# Patient Record
Sex: Male | Born: 1940 | Race: Black or African American | Hispanic: No | Marital: Single | Smoking: Former smoker
Health system: Southern US, Community
[De-identification: ages and names within clinical notes are randomized; demographics above are authoritative.]

## PROBLEM LIST (undated history)

## (undated) DIAGNOSIS — C801 Malignant (primary) neoplasm, unspecified: Secondary | ICD-10-CM

## (undated) DIAGNOSIS — I951 Orthostatic hypotension: Secondary | ICD-10-CM

## (undated) DIAGNOSIS — I10 Essential (primary) hypertension: Secondary | ICD-10-CM

## (undated) DIAGNOSIS — I5032 Chronic diastolic (congestive) heart failure: Secondary | ICD-10-CM

## (undated) DIAGNOSIS — E119 Type 2 diabetes mellitus without complications: Secondary | ICD-10-CM

## (undated) DIAGNOSIS — E785 Hyperlipidemia, unspecified: Secondary | ICD-10-CM

## (undated) DIAGNOSIS — I2699 Other pulmonary embolism without acute cor pulmonale: Secondary | ICD-10-CM

## (undated) DIAGNOSIS — I2510 Atherosclerotic heart disease of native coronary artery without angina pectoris: Secondary | ICD-10-CM

## (undated) DIAGNOSIS — M069 Rheumatoid arthritis, unspecified: Secondary | ICD-10-CM

## (undated) DIAGNOSIS — D497 Neoplasm of unspecified behavior of endocrine glands and other parts of nervous system: Secondary | ICD-10-CM

## (undated) HISTORY — DX: Orthostatic hypotension: I95.1

## (undated) HISTORY — PX: CORONARY STENT PLACEMENT: SHX1402

## (undated) HISTORY — PX: BRAIN SURGERY: SHX531

## (undated) HISTORY — DX: Type 2 diabetes mellitus without complications: E11.9

## (undated) HISTORY — DX: Other pulmonary embolism without acute cor pulmonale: I26.99

## (undated) HISTORY — DX: Chronic diastolic (congestive) heart failure: I50.32

## (undated) HISTORY — DX: Neoplasm of unspecified behavior of endocrine glands and other parts of nervous system: D49.7

---

## 2014-12-11 ENCOUNTER — Encounter (HOSPITAL_COMMUNITY): Payer: Self-pay | Admitting: Emergency Medicine

## 2014-12-11 ENCOUNTER — Inpatient Hospital Stay (HOSPITAL_COMMUNITY)
Admission: EM | Admit: 2014-12-11 | Discharge: 2014-12-15 | DRG: 190 | Disposition: A | Discharge status: Home / Self Care | Payer: Medicare Other | Attending: Internal Medicine | Admitting: Internal Medicine

## 2014-12-11 ENCOUNTER — Emergency Department (HOSPITAL_COMMUNITY): Payer: Medicare Other

## 2014-12-11 DIAGNOSIS — J45909 Unspecified asthma, uncomplicated: Secondary | ICD-10-CM | POA: Diagnosis present

## 2014-12-11 DIAGNOSIS — E8770 Fluid overload, unspecified: Secondary | ICD-10-CM | POA: Diagnosis not present

## 2014-12-11 DIAGNOSIS — F17210 Nicotine dependence, cigarettes, uncomplicated: Secondary | ICD-10-CM | POA: Diagnosis present

## 2014-12-11 DIAGNOSIS — Z881 Allergy status to other antibiotic agents status: Secondary | ICD-10-CM

## 2014-12-11 DIAGNOSIS — Z955 Presence of coronary angioplasty implant and graft: Secondary | ICD-10-CM

## 2014-12-11 DIAGNOSIS — R0602 Shortness of breath: Secondary | ICD-10-CM

## 2014-12-11 DIAGNOSIS — Z7952 Long term (current) use of systemic steroids: Secondary | ICD-10-CM

## 2014-12-11 DIAGNOSIS — E780 Pure hypercholesterolemia: Secondary | ICD-10-CM | POA: Diagnosis present

## 2014-12-11 DIAGNOSIS — J441 Chronic obstructive pulmonary disease with (acute) exacerbation: Secondary | ICD-10-CM

## 2014-12-11 DIAGNOSIS — R0789 Other chest pain: Secondary | ICD-10-CM

## 2014-12-11 DIAGNOSIS — E119 Type 2 diabetes mellitus without complications: Secondary | ICD-10-CM | POA: Diagnosis present

## 2014-12-11 DIAGNOSIS — J9601 Acute respiratory failure with hypoxia: Secondary | ICD-10-CM | POA: Diagnosis present

## 2014-12-11 DIAGNOSIS — J449 Chronic obstructive pulmonary disease, unspecified: Secondary | ICD-10-CM | POA: Diagnosis present

## 2014-12-11 DIAGNOSIS — E785 Hyperlipidemia, unspecified: Secondary | ICD-10-CM | POA: Diagnosis present

## 2014-12-11 DIAGNOSIS — I2510 Atherosclerotic heart disease of native coronary artery without angina pectoris: Secondary | ICD-10-CM | POA: Diagnosis present

## 2014-12-11 DIAGNOSIS — M069 Rheumatoid arthritis, unspecified: Secondary | ICD-10-CM | POA: Diagnosis present

## 2014-12-11 DIAGNOSIS — R0902 Hypoxemia: Secondary | ICD-10-CM

## 2014-12-11 HISTORY — DX: Hyperlipidemia, unspecified: E78.5

## 2014-12-11 HISTORY — DX: Rheumatoid arthritis, unspecified: M06.9

## 2014-12-11 HISTORY — DX: Atherosclerotic heart disease of native coronary artery without angina pectoris: I25.10

## 2014-12-11 LAB — CBC
HEMATOCRIT: 40.8 % (ref 39.0–52.0)
HEMOGLOBIN: 13 g/dL (ref 13.0–17.0)
MCH: 29.7 pg (ref 26.0–34.0)
MCHC: 31.9 g/dL (ref 30.0–36.0)
MCV: 93.2 fL (ref 78.0–100.0)
PLATELETS: 238 10*3/uL (ref 150–400)
RBC: 4.38 MIL/uL (ref 4.22–5.81)
RDW: 14.1 % (ref 11.5–15.5)
WBC: 9.3 10*3/uL (ref 4.0–10.5)

## 2014-12-11 LAB — I-STAT TROPONIN, ED: Troponin i, poc: 0.01 ng/mL (ref 0.00–0.08)

## 2014-12-11 LAB — BASIC METABOLIC PANEL
Anion gap: 9 (ref 5–15)
BUN: 7 mg/dL (ref 6–20)
CALCIUM: 9.1 mg/dL (ref 8.9–10.3)
CO2: 24 mmol/L (ref 22–32)
Chloride: 105 mmol/L (ref 101–111)
Creatinine, Ser: 1.41 mg/dL — ABNORMAL HIGH (ref 0.61–1.24)
GFR calc non Af Amer: 48 mL/min — ABNORMAL LOW (ref >60)
GFR, EST AFRICAN AMERICAN: 55 mL/min — AB (ref >60)
GLUCOSE: 142 mg/dL — AB (ref 65–99)
Potassium: 3.5 mmol/L (ref 3.5–5.1)
Sodium: 138 mmol/L (ref 135–145)

## 2014-12-11 NOTE — ED Notes (Signed)
C/o sob and pain in center of chest since July 2nd.  Also reports productive cough with cream colored sputum since June 26th.    Denies nausea and vomiting.

## 2014-12-11 NOTE — ED Provider Notes (Signed)
CSN: [id number]     Arrival date & time 12/11/14  2009 History  This chart was scribed for Zoie Sarin, MD by Chester Holstein, ED Scribe. This patient was seen in room A04C/A04C and the patient's care was started at 11:24 PM.    Chief Complaint  Patient presents with  . Shortness of Breath  . Chest Pain     Patient is a 74 y.o. male presenting with cough. The history is provided by the patient. No language interpreter was used.  Cough Cough characteristics:  Productive Sputum characteristics: cream. Severity:  Severe Onset quality:  Sudden Duration:  2 weeks Timing:  Intermittent Progression:  Worsening Chronicity:  New Smoker: yes   Context: smoke exposure   Relieved by:  None tried Worsened by:  Nothing tried Ineffective treatments:  None tried Associated symptoms: eye discharge, fever, headaches (mild), myalgias, rhinorrhea, shortness of breath, sore throat and wheezing   Associated symptoms: no chest pain and no chills   Risk factors: recent travel    HPI Comments: Jorge Malone is a 74 y.o. male with PMHx of DM, HLD, and COPD who presents to the Emergency Department complaining of productive cough with onset 11/24/14. Pt notes associated wheezing, chest wall pain with cough, SOB, scratchy throat, fever, headache, and eye drainage. Pt with h/o asthma, reports he has an inhaler which he reports does not work. He states he gets a shot in thigh but is unable to identify the medication. Pt denies h/o seasonal allergies. Pt reports he is currently on prednisone for h/o rheumatoid arthritis. Pt is visiting from [place]. He states he drove to [place] on 11/28/14. He is not on O2 at home. He is allergic to Amoxicillin which gives him diarrhea. Pt is a smoker. Pt denies chest pain and nausea.   Past Medical History  Diagnosis Date  . Diabetes mellitus without complication   . Rheumatoid arthritis   . High cholesterol    Past Surgical History  Procedure Laterality Date  . Coronary  stent placement    . Brain surgery     No family history on file. History  Substance Use Topics  . Smoking status: Current Every Day Smoker  . Smokeless tobacco: Not on file  . Alcohol Use: Yes    Review of Systems  Constitutional: Positive for fever. Negative for chills.  HENT: Positive for congestion, rhinorrhea and sore throat.   Eyes: Positive for discharge.  Respiratory: Positive for cough, shortness of breath and wheezing.   Cardiovascular: Negative for chest pain.  Musculoskeletal: Positive for myalgias.  Neurological: Positive for headaches (mild).  Hematological: Negative for adenopathy.  All other systems reviewed and are negative.     Allergies  Review of patient's allergies indicates not on file.  Home Medications   Prior to Admission medications   Not on File   BP 131/73 mmHg  Pulse 110  Temp(Src) 101.3 F (38.5 C) (Oral)  Resp 24  SpO2 94% Physical Exam  Constitutional: He is oriented to person, place, and time. He appears well-developed and well-nourished.  HENT:  Head: Normocephalic.  Nose: Rhinorrhea present.  Mouth/Throat: Uvula is midline and oropharynx is clear and moist. Mucous membranes are dry. No oropharyngeal exudate.  Nasal congestion and drainage  Eyes: EOM are normal. Pupils are equal, round, and reactive to light. Right eye exhibits discharge (clear, colorless). Left eye exhibits discharge (clear, colorless). Right conjunctiva is injected (mild). Left conjunctiva is injected (mild).  Neck: Normal range of motion. Neck supple.  Easily displaceable  trachea  Cardiovascular: Normal rate, regular rhythm and normal heart sounds.  Exam reveals no friction rub.   No murmur heard. Pulmonary/Chest: Effort normal. No respiratory distress. He has wheezes (bilaterally). He has rhonchi (bilaterally). He has no rales.  Abdominal: Soft. Bowel sounds are normal. There is no tenderness. There is no rebound and no guarding.  Musculoskeletal: Normal range  of motion. He exhibits no edema or tenderness.  Lymphadenopathy:    He has no cervical adenopathy.  Neurological: He is alert and oriented to person, place, and time. He has normal reflexes.  Skin: Skin is warm and dry. He is not diaphoretic.  Psychiatric: He has a normal mood and affect. His behavior is normal.  Nursing note and vitals reviewed.   ED Course  Procedures (including critical care time) DIAGNOSTIC STUDIES: Oxygen Saturation is 94% on room air, normal by my interpretation.    COORDINATION OF CARE: 11:29 PM Discussed treatment plan with patient at beside, the patient agrees with the plan and has no further questions at this time.   Labs Review Labs Reviewed  BASIC METABOLIC PANEL - Abnormal; Notable for the following:    Glucose, Bld 142 (*)    Creatinine, Ser 1.41 (*)    GFR calc non Af Amer 48 (*)    GFR calc Af Amer 55 (*)    All other components within normal limits  CBC  I-STAT TROPOININ, ED    Imaging Review Dg Chest 2 View  12/11/2014   CLINICAL DATA:  Left-sided chest pain for 1 day  EXAM: CHEST - 2 VIEW  COMPARISON:  None.  FINDINGS: The heart size and mediastinal contours are within normal limits. Both lungs are clear. The visualized skeletal structures are unremarkable.  IMPRESSION: No active disease.   Electronically Signed   By: Inez Catalina M.D.   On: 12/11/2014 21:42     EKG Interpretation None      EKG Interpretation  Date/Time:  Wednesday December 11 2014 20:19:31 EDT Ventricular Rate:  106 PR Interval:  140 QRS Duration: 76 QT Interval:  336 QTC Calculation: 446 R Axis:   83 Text Interpretation:  Sinus tachycardia with Premature supraventricular complexes and with occasional Premature ventricular complexes Low voltage QRS Borderline ECG ED PHYSICIAN INTERPRETATION AVAILABLE IN [facility] Confirmed by TEST, Record ([id number]) on 12/12/2014 7:37:41 AM       MDM   Final diagnoses:  None   Results for orders placed or performed during the  hospital encounter of 12/11/14  Rapid strep screen (not at [facility])  Result Value Ref Range   Streptococcus, Group A Screen (Direct) NEGATIVE NEGATIVE  Culture, blood (routine x 2) Call MD if unable to obtain prior to antibiotics being given  Result Value Ref Range   Specimen Description BLOOD RIGHT ARM    Special Requests      BOTTLES DRAWN AEROBIC AND ANAEROBIC 10CC AEROBIC 5CC ANAEROBIC   Culture PENDING    Report Status PENDING   Basic metabolic panel  Result Value Ref Range   Sodium 138 135 - 145 mmol/L   Potassium 3.5 3.5 - 5.1 mmol/L   Chloride 105 101 - 111 mmol/L   CO2 24 22 - 32 mmol/L   Glucose, Bld 142 (H) 65 - 99 mg/dL   BUN 7 6 - 20 mg/dL   Creatinine, Ser 1.41 (H) 0.61 - 1.24 mg/dL   Calcium 9.1 8.9 - 10.3 mg/dL   GFR calc non Af Amer 48 (L) >60 mL/min   GFR calc  Af Amer 55 (L) >60 mL/min   Anion gap 9 5 - 15  CBC  Result Value Ref Range   WBC 9.3 4.0 - 10.5 K/uL   RBC 4.38 4.22 - 5.81 MIL/uL   Hemoglobin 13.0 13.0 - 17.0 g/dL   HCT 40.8 39.0 - 52.0 %   MCV 93.2 78.0 - 100.0 fL   MCH 29.7 26.0 - 34.0 pg   MCHC 31.9 30.0 - 36.0 g/dL   RDW 14.1 11.5 - 15.5 %   Platelets 238 150 - 400 K/uL  I-stat troponin, ED  Result Value Ref Range   Troponin i, poc 0.01 0.00 - 0.08 ng/mL   Comment 3           Dg Chest 2 View  12/11/2014   CLINICAL DATA:  Left-sided chest pain for 1 day  EXAM: CHEST - 2 VIEW  COMPARISON:  None.  FINDINGS: The heart size and mediastinal contours are within normal limits. Both lungs are clear. The visualized skeletal structures are unremarkable.  IMPRESSION: No active disease.   Electronically Signed   By: Inez Catalina M.D.   On: 12/11/2014 21:42   Ct Angio Chest Pe W/cm &/or Wo Cm  12/12/2014   CLINICAL DATA:  Left mid chest pain and shortness of breath starting at the end of June. Cough.  EXAM: CT ANGIOGRAPHY CHEST WITH CONTRAST  TECHNIQUE: Multidetector CT imaging of the chest was performed using the standard protocol during bolus  administration of intravenous contrast. Multiplanar CT image reconstructions and MIPs were obtained to evaluate the vascular anatomy.  CONTRAST:  57mL OMNIPAQUE IOHEXOL 350 MG/ML SOLN  COMPARISON:  None.  FINDINGS: Technically adequate study with good opacification of the segmental and central pulmonary arteries. No focal filling defect. No evidence of significant pulmonary embolus.  Normal heart size. Coronary artery calcification. Normal caliber thoracic aorta. Great vessel origins are patent. Esophagus is decompressed. There are scattered lymph nodes diffusely throughout the mediastinum and right infrahilar region. Largest lymph nodes measure up to about 12 mm short axis dimension. Appearance is nonspecific. These could represent reactive nodes although early lymphoma or neoplastic changes not excluded.  Evaluation of lungs is limited due to respiratory motion artifact. There is evidence of atelectasis or scarring in the lung bases. No distinct focal nodules or consolidation demonstrated. Airways are patent. No pleural effusion. No pneumothorax.  Included portions of the upper abdominal organs are grossly unremarkable. Degenerative changes throughout the spine. No destructive bone lesions.  Review of the MIP images confirms the above findings.  IMPRESSION: No evidence of significant pulmonary embolus. Indeterminate mildly enlarged lymph nodes in the mediastinum are probably reactive. Fibrosis or atelectasis in the lung bases.   Electronically Signed   By: Lucienne Capers M.D.   On: 12/12/2014 02:48     Medications  metoprolol tartrate (LOPRESSOR) tablet 25 mg (not administered)  rosuvastatin (CRESTOR) tablet 5 mg (not administered)  folic acid (FOLVITE) tablet 1 mg (not administered)  predniSONE (DELTASONE) tablet 50 mg (not administered)  insulin aspart (novoLOG) injection 0-15 Units (not administered)  heparin injection 5,000 Units (5,000 Units Subcutaneous Given 12/12/14 0600)  acetaminophen  (TYLENOL) tablet 650 mg (650 mg Oral Given 12/12/14 0600)    Or  acetaminophen (TYLENOL) suppository 650 mg ( Rectal See Alternative 12/12/14 0600)  cefTRIAXone (ROCEPHIN) 1 g in dextrose 5 % 50 mL IVPB (not administered)  azithromycin (ZITHROMAX) tablet 500 mg (not administered)  0.9 %  sodium chloride infusion ( Intravenous Transfusing/Transfer 12/12/14 0404)  iohexol (  OMNIPAQUE) 350 MG/ML injection 100 mL (80 mLs Intravenous Contrast Given 12/12/14 0226)  sodium chloride 0.9 % bolus 1,000 mL (1,000 mLs Intravenous Transfusing/Transfer 12/12/14 0404)  methylPREDNISolone sodium succinate (SOLU-MEDROL) 125 mg/2 mL injection 125 mg (125 mg Intravenous Given 12/12/14 0331)  albuterol (PROVENTIL) (2.5 MG/3ML) 0.083% nebulizer solution 5 mg (5 mg Nebulization Given 12/12/14 0330)  cefTRIAXone (ROCEPHIN) 1 g in dextrose 5 % 50 mL IVPB (1 g Intravenous Transfusing/Transfer 12/12/14 0404)  azithromycin (ZITHROMAX) 500 mg in dextrose 5 % 250 mL IVPB (500 mg Intravenous Given 12/12/14 0554)   Will admit suspect burgeoning PNA on immune suppressants (prednisone and ENBREL)    I personally performed the services described in this documentation, which was scribed in my presence. The recorded information has been reviewed and is accurate.     Veatrice Kells, MD 12/12/14 [phone]

## 2014-12-12 ENCOUNTER — Emergency Department (HOSPITAL_COMMUNITY): Payer: Medicare Other

## 2014-12-12 ENCOUNTER — Encounter (HOSPITAL_COMMUNITY): Payer: Self-pay

## 2014-12-12 DIAGNOSIS — E785 Hyperlipidemia, unspecified: Secondary | ICD-10-CM | POA: Diagnosis present

## 2014-12-12 DIAGNOSIS — J441 Chronic obstructive pulmonary disease with (acute) exacerbation: Principal | ICD-10-CM

## 2014-12-12 DIAGNOSIS — J449 Chronic obstructive pulmonary disease, unspecified: Secondary | ICD-10-CM | POA: Diagnosis present

## 2014-12-12 DIAGNOSIS — I2510 Atherosclerotic heart disease of native coronary artery without angina pectoris: Secondary | ICD-10-CM | POA: Diagnosis present

## 2014-12-12 DIAGNOSIS — J45909 Unspecified asthma, uncomplicated: Secondary | ICD-10-CM | POA: Diagnosis present

## 2014-12-12 DIAGNOSIS — E119 Type 2 diabetes mellitus without complications: Secondary | ICD-10-CM

## 2014-12-12 DIAGNOSIS — J9601 Acute respiratory failure with hypoxia: Secondary | ICD-10-CM | POA: Diagnosis present

## 2014-12-12 DIAGNOSIS — R0602 Shortness of breath: Secondary | ICD-10-CM | POA: Diagnosis present

## 2014-12-12 DIAGNOSIS — E8770 Fluid overload, unspecified: Secondary | ICD-10-CM | POA: Diagnosis not present

## 2014-12-12 DIAGNOSIS — Z7952 Long term (current) use of systemic steroids: Secondary | ICD-10-CM | POA: Diagnosis not present

## 2014-12-12 DIAGNOSIS — Z955 Presence of coronary angioplasty implant and graft: Secondary | ICD-10-CM | POA: Diagnosis not present

## 2014-12-12 DIAGNOSIS — E780 Pure hypercholesterolemia: Secondary | ICD-10-CM | POA: Diagnosis present

## 2014-12-12 DIAGNOSIS — F17210 Nicotine dependence, cigarettes, uncomplicated: Secondary | ICD-10-CM | POA: Diagnosis present

## 2014-12-12 DIAGNOSIS — M069 Rheumatoid arthritis, unspecified: Secondary | ICD-10-CM | POA: Diagnosis present

## 2014-12-12 DIAGNOSIS — Z881 Allergy status to other antibiotic agents status: Secondary | ICD-10-CM | POA: Diagnosis not present

## 2014-12-12 DIAGNOSIS — R0600 Dyspnea, unspecified: Secondary | ICD-10-CM | POA: Diagnosis not present

## 2014-12-12 LAB — RAPID STREP SCREEN (MED CTR MEBANE ONLY): Streptococcus, Group A Screen (Direct): NEGATIVE

## 2014-12-12 LAB — HIV ANTIBODY (ROUTINE TESTING W REFLEX): HIV Screen 4th Generation wRfx: NONREACTIVE

## 2014-12-12 LAB — GLUCOSE, CAPILLARY
GLUCOSE-CAPILLARY: 180 mg/dL — AB (ref 65–99)
Glucose-Capillary: 187 mg/dL — ABNORMAL HIGH (ref 65–99)
Glucose-Capillary: 164 mg/dL — ABNORMAL HIGH (ref 65–99)

## 2014-12-12 MED ORDER — AZITHROMYCIN 500 MG PO TABS
500.0000 mg | ORAL_TABLET | ORAL | Status: DC
  Administered 2014-12-13: 500 mg via ORAL
  Filled 2014-12-12 (×2): qty 1

## 2014-12-12 MED ORDER — LEVALBUTEROL HCL 0.63 MG/3ML IN NEBU
0.6300 mg | INHALATION_SOLUTION | Freq: Two times a day (BID) | RESPIRATORY_TRACT | Status: DC
  Administered 2014-12-12 – 2014-12-15 (×6): 0.63 mg via RESPIRATORY_TRACT
  Filled 2014-12-12 (×12): qty 3

## 2014-12-12 MED ORDER — METOPROLOL TARTRATE 25 MG PO TABS
25.0000 mg | ORAL_TABLET | Freq: Every day | ORAL | Status: DC
  Administered 2014-12-12 – 2014-12-15 (×4): 25 mg via ORAL
  Filled 2014-12-12 (×4): qty 1

## 2014-12-12 MED ORDER — DEXTROSE 5 % IV SOLN
500.0000 mg | Freq: Once | INTRAVENOUS | Status: AC
  Administered 2014-12-12: 500 mg via INTRAVENOUS
  Filled 2014-12-12: qty 500

## 2014-12-12 MED ORDER — PREDNISONE 50 MG PO TABS: 50.0000 mg | ORAL_TABLET | Freq: Every day | ORAL | Status: DC

## 2014-12-12 MED ORDER — HEPARIN SODIUM (PORCINE) 5000 UNIT/ML IJ SOLN
5000.0000 [IU] | Freq: Three times a day (TID) | INTRAMUSCULAR | Status: DC
  Administered 2014-12-12 – 2014-12-15 (×11): 5000 [IU] via SUBCUTANEOUS
  Filled 2014-12-12 (×15): qty 1

## 2014-12-12 MED ORDER — ROSUVASTATIN CALCIUM 5 MG PO TABS
5.0000 mg | ORAL_TABLET | Freq: Every day | ORAL | Status: DC
  Administered 2014-12-12 – 2014-12-14 (×3): 5 mg via ORAL
  Filled 2014-12-12 (×6): qty 1

## 2014-12-12 MED ORDER — DEXTROSE 5 % IV SOLN
1.0000 g | INTRAVENOUS | Status: DC
  Administered 2014-12-13: 1 g via INTRAVENOUS
  Filled 2014-12-12 (×2): qty 10

## 2014-12-12 MED ORDER — ACETAMINOPHEN 650 MG RE SUPP: 650.0000 mg | Freq: Four times a day (QID) | RECTAL | Status: DC | PRN

## 2014-12-12 MED ORDER — IPRATROPIUM BROMIDE 0.02 % IN SOLN
0.5000 mg | Freq: Four times a day (QID) | RESPIRATORY_TRACT | Status: DC
  Administered 2014-12-12: 0.5 mg via RESPIRATORY_TRACT

## 2014-12-12 MED ORDER — METHYLPREDNISOLONE SODIUM SUCC 125 MG IJ SOLR
125.0000 mg | Freq: Once | INTRAMUSCULAR | Status: AC
Start: 2014-12-12 — End: 2014-12-12
  Administered 2014-12-12: 125 mg via INTRAVENOUS
  Filled 2014-12-12: qty 2

## 2014-12-12 MED ORDER — GUAIFENESIN ER 600 MG PO TB12
1200.0000 mg | ORAL_TABLET | Freq: Two times a day (BID) | ORAL | Status: DC
  Administered 2014-12-12 – 2014-12-15 (×7): 1200 mg via ORAL
  Filled 2014-12-12 (×9): qty 2

## 2014-12-12 MED ORDER — LEVALBUTEROL HCL 0.63 MG/3ML IN NEBU
0.6300 mg | INHALATION_SOLUTION | RESPIRATORY_TRACT | Status: DC | PRN
  Filled 2014-12-12: qty 3

## 2014-12-12 MED ORDER — PREDNISONE 50 MG PO TABS
60.0000 mg | ORAL_TABLET | Freq: Every day | ORAL | Status: DC
  Administered 2014-12-12 – 2014-12-14 (×3): 60 mg via ORAL
  Filled 2014-12-12 (×6): qty 1

## 2014-12-12 MED ORDER — ACETAMINOPHEN 325 MG PO TABS
650.0000 mg | ORAL_TABLET | Freq: Four times a day (QID) | ORAL | Status: DC | PRN
  Administered 2014-12-12: 650 mg via ORAL
  Filled 2014-12-12: qty 2

## 2014-12-12 MED ORDER — IPRATROPIUM BROMIDE 0.02 % IN SOLN
0.5000 mg | RESPIRATORY_TRACT | Status: DC | PRN
  Filled 2014-12-12: qty 2.5

## 2014-12-12 MED ORDER — ALBUTEROL SULFATE (2.5 MG/3ML) 0.083% IN NEBU
5.0000 mg | INHALATION_SOLUTION | Freq: Once | RESPIRATORY_TRACT | Status: AC
  Administered 2014-12-12: 5 mg via RESPIRATORY_TRACT
  Filled 2014-12-12: qty 6

## 2014-12-12 MED ORDER — LEVALBUTEROL HCL 0.63 MG/3ML IN NEBU
0.6300 mg | INHALATION_SOLUTION | Freq: Four times a day (QID) | RESPIRATORY_TRACT | Status: DC
  Administered 2014-12-12: 0.63 mg via RESPIRATORY_TRACT

## 2014-12-12 MED ORDER — LEVOFLOXACIN IN D5W 750 MG/150ML IV SOLN: 750.0000 mg | Freq: Once | INTRAVENOUS | Status: DC

## 2014-12-12 MED ORDER — IPRATROPIUM BROMIDE 0.02 % IN SOLN
0.5000 mg | Freq: Two times a day (BID) | RESPIRATORY_TRACT | Status: DC
  Administered 2014-12-12 – 2014-12-15 (×6): 0.5 mg via RESPIRATORY_TRACT
  Filled 2014-12-12 (×6): qty 2.5

## 2014-12-12 MED ORDER — SODIUM CHLORIDE 0.9 % IV SOLN
INTRAVENOUS | Status: DC
  Administered 2014-12-12 (×2): via INTRAVENOUS

## 2014-12-12 MED ORDER — DEXTROSE 5 % IV SOLN
1.0000 g | Freq: Once | INTRAVENOUS | Status: AC
  Administered 2014-12-12: 1 g via INTRAVENOUS
  Filled 2014-12-12: qty 10

## 2014-12-12 MED ORDER — INSULIN ASPART 100 UNIT/ML ~~LOC~~ SOLN
0.0000 [IU] | Freq: Three times a day (TID) | SUBCUTANEOUS | Status: DC
  Administered 2014-12-12 – 2014-12-14 (×5): 3 [IU] via SUBCUTANEOUS

## 2014-12-12 MED ORDER — FOLIC ACID 1 MG PO TABS
1.0000 mg | ORAL_TABLET | Freq: Every day | ORAL | Status: DC
  Administered 2014-12-12 – 2014-12-15 (×4): 1 mg via ORAL
  Filled 2014-12-12 (×4): qty 1

## 2014-12-12 MED ORDER — SODIUM CHLORIDE 0.9 % IV BOLUS (SEPSIS)
1000.0000 mL | Freq: Once | INTRAVENOUS | Status: AC
  Administered 2014-12-12: 1000 mL via INTRAVENOUS

## 2014-12-12 MED ORDER — IOHEXOL 350 MG/ML SOLN
100.0000 mL | Freq: Once | INTRAVENOUS | Status: AC | PRN
  Administered 2014-12-12: 80 mL via INTRAVENOUS

## 2014-12-12 NOTE — Progress Notes (Signed)
New Admission Note:  Arrival Method: via stretcher  Mental Orientation:Alert &oriented x4 Telemetry: on box 18 Assessment: Completed Skin: edema BLE, drainage from both eyes, swollen knuckles IV: left upper arm Pain: pain upon coughing, see MAR Tubes: n/a Admission: Completed [address] Orientation: Patient has been orientated to the room, unit and the staff. Family: none at bedside  Orders have been reviewed and implemented. Will continue to monitor the patient. Call light has been placed within reach and bed alarm has been activated.   Leandro Reasoner BSN, RN  Phone Number: [phone] [facility]

## 2014-12-12 NOTE — ED Notes (Signed)
Patient transported to CT scan . 

## 2014-12-12 NOTE — Progress Notes (Signed)
I have seen and assessed this patient and agree with Dr. Alcario Drought assessment and plan. Patient states he is feeling much better than on admission. Continue steroids, IV antibiotics, IV fluids, nebulizer treatments. Follow.

## 2014-12-12 NOTE — ED Notes (Signed)
Report given to RN on 6E. 

## 2014-12-12 NOTE — H&P (Addendum)
[facility] History and Physical  Jorge Malone NGE:[id number] DOB: Aug 15, 1940 DOA: 12/11/2014  Referring physician: EDP PCP: No primary care provider on file.   Chief Complaint: SOB   HPI: Jorge Malone is a 74 y.o. male with h/o COPD, presents to ED with c/o productive cough onset on 6/26.  There has been associated wheezing, chest tightness, SOB, fever, headache.  Inhaler not providing relief.  Is on immunosuppressive embril for his chronic rheumatoid arthritis as well as chronic daily prednisone.  Not on O2 at home.  Review of Systems: Systems reviewed.  As above, otherwise negative  Past Medical History  Diagnosis Date  . Diabetes mellitus without complication   . Rheumatoid arthritis   . High cholesterol   . Coronary artery disease    Past Surgical History  Procedure Laterality Date  . Coronary stent placement    . Brain surgery     Social History:  reports that he has been smoking.  He does not have any smokeless tobacco history on file. He reports that he drinks alcohol. He reports that he does not use illicit drugs.  Allergies  Allergen Reactions  . Amoxicillin     No family history on file.   Prior to Admission medications   Medication Sig Start Date End Date Taking? Authorizing Provider  Etanercept (ENBREL [place]) Inject 1 Dose into the skin once a week. Takes weekly on Thursday   Yes Historical Provider, MD  fenofibrate (TRICOR) 48 MG tablet Take 48 mg by mouth daily.   Yes Historical Provider, MD  folic acid (FOLVITE) 1 MG tablet Take 1 mg by mouth daily.   Yes Historical Provider, MD  hydrochlorothiazide (HYDRODIURIL) 25 MG tablet Take 25 mg by mouth daily.   Yes Historical Provider, MD  metFORMIN (GLUCOPHAGE) 1000 MG tablet Take 1,000 mg by mouth daily with breakfast.   Yes Historical Provider, MD  metoprolol tartrate (LOPRESSOR) 25 MG tablet Take 25 mg by mouth daily.   Yes Historical Provider, MD  predniSONE (DELTASONE) 5 MG tablet Take 5 mg by mouth daily  with breakfast.   Yes Historical Provider, MD  rosuvastatin (CRESTOR) 5 MG tablet Take 5 mg by mouth daily.   Yes Historical Provider, MD   Physical Exam: Filed Vitals:   12/12/14 0300  BP: 172/92  Pulse: 109  Temp:   Resp: 20    BP 172/92 mmHg  Pulse 109  Temp(Src) 100.7 F (38.2 C) (Oral)  Resp 20  SpO2 92%  General Appearance:    Alert, oriented, no distress, appears stated age  Head:    Normocephalic, atraumatic  Eyes:    PERRL, EOMI, sclera non-icteric        Nose:   Nares without drainage or epistaxis. Mucosa, turbinates normal  Throat:   Moist mucous membranes. Oropharynx without erythema or exudate.  Neck:   Supple. No carotid bruits.  No thyromegaly.  No lymphadenopathy.   Back:     No CVA tenderness, no spinal tenderness  Lungs:     Clear to auscultation bilaterally, without wheezes, rhonchi or rales  Chest wall:    No tenderness to palpitation  Heart:    Regular rate and rhythm without murmurs, gallops, rubs  Abdomen:     Soft, non-tender, nondistended, normal bowel sounds, no organomegaly  Genitalia:    deferred  Rectal:    deferred  Extremities:   No clubbing, cyanosis or edema.  Pulses:   2+ and symmetric all extremities  Skin:   Skin color, texture, turgor  normal, no rashes or lesions  Lymph nodes:   Cervical, supraclavicular, and axillary nodes normal  Neurologic:   CNII-XII intact. Normal strength, sensation and reflexes      throughout    Labs on Admission:  Basic Metabolic Panel:  Recent Labs Lab 12/11/14 2037  NA 138  K 3.5  CL 105  CO2 24  GLUCOSE 142*  BUN 7  CREATININE 1.41*  CALCIUM 9.1   Liver Function Tests: No results for input(s): AST, ALT, ALKPHOS, BILITOT, PROT, ALBUMIN in the last 168 hours. No results for input(s): LIPASE, AMYLASE in the last 168 hours. No results for input(s): AMMONIA in the last 168 hours. CBC:  Recent Labs Lab 12/11/14 2037  WBC 9.3  HGB 13.0  HCT 40.8  MCV 93.2  PLT 238   Cardiac Enzymes: No  results for input(s): CKTOTAL, CKMB, CKMBINDEX, TROPONINI in the last 168 hours.  BNP (last 3 results) No results for input(s): PROBNP in the last 8760 hours. CBG: No results for input(s): GLUCAP in the last 168 hours.  Radiological Exams on Admission: Dg Chest 2 View  12/11/2014   CLINICAL DATA:  Left-sided chest pain for 1 day  EXAM: CHEST - 2 VIEW  COMPARISON:  None.  FINDINGS: The heart size and mediastinal contours are within normal limits. Both lungs are clear. The visualized skeletal structures are unremarkable.  IMPRESSION: No active disease.   Electronically Signed   By: Inez Catalina M.D.   On: 12/11/2014 21:42   Ct Angio Chest Pe W/cm &/or Wo Cm  12/12/2014   CLINICAL DATA:  Left mid chest pain and shortness of breath starting at the end of June. Cough.  EXAM: CT ANGIOGRAPHY CHEST WITH CONTRAST  TECHNIQUE: Multidetector CT imaging of the chest was performed using the standard protocol during bolus administration of intravenous contrast. Multiplanar CT image reconstructions and MIPs were obtained to evaluate the vascular anatomy.  CONTRAST:  46mL OMNIPAQUE IOHEXOL 350 MG/ML SOLN  COMPARISON:  None.  FINDINGS: Technically adequate study with good opacification of the segmental and central pulmonary arteries. No focal filling defect. No evidence of significant pulmonary embolus.  Normal heart size. Coronary artery calcification. Normal caliber thoracic aorta. Great vessel origins are patent. Esophagus is decompressed. There are scattered lymph nodes diffusely throughout the mediastinum and right infrahilar region. Largest lymph nodes measure up to about 12 mm short axis dimension. Appearance is nonspecific. These could represent reactive nodes although early lymphoma or neoplastic changes not excluded.  Evaluation of lungs is limited due to respiratory motion artifact. There is evidence of atelectasis or scarring in the lung bases. No distinct focal nodules or consolidation demonstrated. Airways  are patent. No pleural effusion. No pneumothorax.  Included portions of the upper abdominal organs are grossly unremarkable. Degenerative changes throughout the spine. No destructive bone lesions.  Review of the MIP images confirms the above findings.  IMPRESSION: No evidence of significant pulmonary embolus. Indeterminate mildly enlarged lymph nodes in the mediastinum are probably reactive. Fibrosis or atelectasis in the lung bases.   Electronically Signed   By: Lucienne Capers M.D.   On: 12/12/2014 02:48    EKG: Independently reviewed.  Assessment/Plan Principal Problem:   COPD exacerbation Active Problems:   Rheumatoid arthritis   DM2 (diabetes mellitus, type 2)   CAD (coronary artery disease)   Acute respiratory failure with hypoxia   1. COPD exacerbation causing acute respiratory failure with hypoxia - with fever, productive cough, will go ahead and treat for PNA clinically even  though CXR negative. 1. Adult wheeze protocol 2. Prednisone PO 3. Hold embril 4. Continuous pulse ox 5. PNA pathway 6. Cultures pending 2. RA - hold embril, on stress dose steroids instead 3. DM2 - hold metformin, putting patient on med dose SSI AC/HS    Code Status: Full  Family Communication: No family in room Disposition Plan: Admit to inpatient   Time spent: 53 min  GARDNER, JARED M. [facility] Pager [phone]  If 7AM-7PM, please contact the day team taking care of the patient [email or web address] Password [facility] 12/12/2014, 3:27 AM

## 2014-12-13 ENCOUNTER — Encounter (HOSPITAL_COMMUNITY): Payer: Self-pay | Admitting: Internal Medicine

## 2014-12-13 ENCOUNTER — Inpatient Hospital Stay (HOSPITAL_COMMUNITY): Payer: Medicare Other

## 2014-12-13 DIAGNOSIS — E785 Hyperlipidemia, unspecified: Secondary | ICD-10-CM | POA: Diagnosis present

## 2014-12-13 HISTORY — DX: Hyperlipidemia, unspecified: E78.5

## 2014-12-13 LAB — CBC
HCT: 37.8 % — ABNORMAL LOW (ref 39.0–52.0)
Hemoglobin: 12 g/dL — ABNORMAL LOW (ref 13.0–17.0)
MCH: 29.6 pg (ref 26.0–34.0)
MCHC: 31.7 g/dL (ref 30.0–36.0)
MCV: 93.1 fL (ref 78.0–100.0)
Platelets: 203 10*3/uL (ref 150–400)
RBC: 4.06 MIL/uL — ABNORMAL LOW (ref 4.22–5.81)
RDW: 13.8 % (ref 11.5–15.5)
WBC: 10.8 10*3/uL — ABNORMAL HIGH (ref 4.0–10.5)

## 2014-12-13 LAB — BASIC METABOLIC PANEL
Anion gap: 5 (ref 5–15)
BUN: 11 mg/dL (ref 6–20)
CHLORIDE: 111 mmol/L (ref 101–111)
CO2: 24 mmol/L (ref 22–32)
Calcium: 9.1 mg/dL (ref 8.9–10.3)
Creatinine, Ser: 0.92 mg/dL (ref 0.61–1.24)
GFR calc Af Amer: >60 mL/min (ref >60)
GFR calc non Af Amer: >60 mL/min (ref >60)
Glucose, Bld: 141 mg/dL — ABNORMAL HIGH (ref 65–99)
Potassium: 4.1 mmol/L (ref 3.5–5.1)
Sodium: 140 mmol/L (ref 135–145)

## 2014-12-13 LAB — GLUCOSE, CAPILLARY
GLUCOSE-CAPILLARY: 154 mg/dL — AB (ref 65–99)
GLUCOSE-CAPILLARY: 106 mg/dL — AB (ref 65–99)
Glucose-Capillary: 102 mg/dL — ABNORMAL HIGH (ref 65–99)
Glucose-Capillary: 161 mg/dL — ABNORMAL HIGH (ref 65–99)

## 2014-12-13 LAB — STREP PNEUMONIAE URINARY ANTIGEN: Strep Pneumo Urinary Antigen: NEGATIVE

## 2014-12-13 MED ORDER — LEVOFLOXACIN 500 MG PO TABS
500.0000 mg | ORAL_TABLET | Freq: Every day | ORAL | Status: DC
  Administered 2014-12-13 – 2014-12-15 (×3): 500 mg via ORAL
  Filled 2014-12-13 (×3): qty 1

## 2014-12-13 MED ORDER — FUROSEMIDE 10 MG/ML IJ SOLN
40.0000 mg | Freq: Two times a day (BID) | INTRAMUSCULAR | Status: AC
  Administered 2014-12-13 – 2014-12-14 (×2): 40 mg via INTRAVENOUS
  Filled 2014-12-13 (×2): qty 4

## 2014-12-13 MED ORDER — FUROSEMIDE 10 MG/ML IJ SOLN: 40.0000 mg | Freq: Once | INTRAMUSCULAR | Status: DC

## 2014-12-13 NOTE — Progress Notes (Signed)
[facility] PROGRESS NOTE  Jorge Malone OMV:[id number] DOB: 01/30/1941 DOA: 12/11/2014 PCP: No primary care provider on file.  Assessment/Plan: #1 acute respiratory failure with hypoxia secondary to acute COPD exacerbation Patient with clinical improvement. Repeat chest x-ray to rule out a pneumonia. Repeat chest x-ray with some volume overload which was not present on admitting chest x-ray, likely secondary to fluid resuscitation and holding patient's diuretics. Urine strep pneumococcus antigen is negative. Blood cultures pending. Sputum Gram stain and cultures pending. Rapid strep screen was negative. Patient is +1.9 L during this hospitalization. Saline lock IV fluids. Continue prednisone, scheduled nebs, change IV Rocephin and oral azithromycin to Levaquin. We'll give a dose of IV Lasix 1.  #2 history of rheumatoid arthritis Embril on hold. Continue current dose of prednisone and will taper down to home dose.  #3 diabetes mellitus type 2 Oral hypoglycemics on hold. Sliding scale insulin.  #4 coronary artery disease Stable. Continue beta blocker. HCTZ was held on admission. Continue statin.  #5 hyperlipidemia Continue statin.  #6 prophylaxis Heparin for DVT prophylaxis.  Code Status: Full Family Communication: Updated patient. Updated daughter via telephone. Disposition Plan: Home when medically stable.   Consultants:  None  Procedures:  Chest x-ray 12/13/2014  Antibiotics:  IV Rocephin 12/13/2014  Oral azithromycin 12/13/2014  HPI/Subjective: Patient states shortness of breath has improved. Patient states he's feeling better. Patient still complaining of cough.  Objective: Filed Vitals:   12/13/14 1738  BP: 131/71  Pulse: 75  Temp: 98 F (36.7 C)  Resp: 20    Intake/Output Summary (Last 24 hours) at 12/13/14 1844 Last data filed at 12/13/14 1700  Gross per 24 hour  Intake   2150 ml  Output    550 ml  Net   1600 ml   There were no vitals filed  for this visit.  Exam:   General:  NAD  Cardiovascular: RRR  Respiratory: Minimal expiratory wheezing no rhonchi. Some scattered coarse breath sounds. No crackles.  Abdomen: Obese, soft, nontender, nondistended, positive bowel sounds.  Musculoskeletal: No clubbing cyanosis or edema.  Data Reviewed: Basic Metabolic Panel:  Recent Labs Lab 12/11/14 2037 12/13/14 0404  NA 138 140  K 3.5 4.1  CL 105 111  CO2 24 24  GLUCOSE 142* 141*  BUN 7 11  CREATININE 1.41* 0.92  CALCIUM 9.1 9.1   Liver Function Tests: No results for input(s): AST, ALT, ALKPHOS, BILITOT, PROT, ALBUMIN in the last 168 hours. No results for input(s): LIPASE, AMYLASE in the last 168 hours. No results for input(s): AMMONIA in the last 168 hours. CBC:  Recent Labs Lab 12/11/14 2037 12/13/14 0404  WBC 9.3 10.8*  HGB 13.0 12.0*  HCT 40.8 37.8*  MCV 93.2 93.1  PLT 238 203   Cardiac Enzymes: No results for input(s): CKTOTAL, CKMB, CKMBINDEX, TROPONINI in the last 168 hours. BNP (last 3 results) No results for input(s): BNP in the last 8760 hours.  ProBNP (last 3 results) No results for input(s): PROBNP in the last 8760 hours.  CBG:  Recent Labs Lab 12/12/14 1610 12/12/14 2102 12/13/14 0803 12/13/14 1201 12/13/14 1614  GLUCAP 164* 154* 102* 106* 161*    Recent Results (from the past 240 hour(s))  Rapid strep screen (not at [facility])     Status: None   Collection Time: 12/11/14 11:55 PM  Result Value Ref Range Status   Streptococcus, Group A Screen (Direct) NEGATIVE NEGATIVE Final    Comment: (NOTE) A Rapid Antigen test may result negative if the antigen  level in the sample is below the detection level of this test. The FDA has not cleared this test as a stand-alone test therefore the rapid antigen negative result has reflexed to a Group A Strep culture.   Culture, blood (routine x 2) Call MD if unable to obtain prior to antibiotics being given     Status: None (Preliminary result)    Collection Time: 12/12/14  3:38 AM  Result Value Ref Range Status   Specimen Description BLOOD RIGHT ARM  Final   Special Requests   Final    BOTTLES DRAWN AEROBIC AND ANAEROBIC 10CC AEROBIC 5CC ANAEROBIC   Culture NO GROWTH 1 DAY  Final   Report Status PENDING  Incomplete  Culture, blood (routine x 2) Call MD if unable to obtain prior to antibiotics being given     Status: None (Preliminary result)   Collection Time: 12/12/14  3:45 AM  Result Value Ref Range Status   Specimen Description BLOOD RIGHT HAND  Final   Special Requests BOTTLES DRAWN AEROBIC ONLY 5CC  Final   Culture NO GROWTH 1 DAY  Final   Report Status PENDING  Incomplete     Studies: Dg Chest 2 View  12/13/2014   CLINICAL DATA:  Hypoxia  EXAM: CHEST  2 VIEW  COMPARISON:  12/11/2014  FINDINGS: Cardiac enlargement. Progressive vascular congestion and interstitial edema compared to the prior study. Small left effusion has developed. Findings consistent with fluid overload.  IMPRESSION: Progression of congestive heart failure with interstitial edema and small left effusion   Electronically Signed   By: Franchot Gallo M.D.   On: 12/13/2014 14:46   Dg Chest 2 View  12/11/2014   CLINICAL DATA:  Left-sided chest pain for 1 day  EXAM: CHEST - 2 VIEW  COMPARISON:  None.  FINDINGS: The heart size and mediastinal contours are within normal limits. Both lungs are clear. The visualized skeletal structures are unremarkable.  IMPRESSION: No active disease.   Electronically Signed   By: Inez Catalina M.D.   On: 12/11/2014 21:42   Ct Angio Chest Pe W/cm &/or Wo Cm  12/12/2014   CLINICAL DATA:  Left mid chest pain and shortness of breath starting at the end of June. Cough.  EXAM: CT ANGIOGRAPHY CHEST WITH CONTRAST  TECHNIQUE: Multidetector CT imaging of the chest was performed using the standard protocol during bolus administration of intravenous contrast. Multiplanar CT image reconstructions and MIPs were obtained to evaluate the vascular  anatomy.  CONTRAST:  66mL OMNIPAQUE IOHEXOL 350 MG/ML SOLN  COMPARISON:  None.  FINDINGS: Technically adequate study with good opacification of the segmental and central pulmonary arteries. No focal filling defect. No evidence of significant pulmonary embolus.  Normal heart size. Coronary artery calcification. Normal caliber thoracic aorta. Great vessel origins are patent. Esophagus is decompressed. There are scattered lymph nodes diffusely throughout the mediastinum and right infrahilar region. Largest lymph nodes measure up to about 12 mm short axis dimension. Appearance is nonspecific. These could represent reactive nodes although early lymphoma or neoplastic changes not excluded.  Evaluation of lungs is limited due to respiratory motion artifact. There is evidence of atelectasis or scarring in the lung bases. No distinct focal nodules or consolidation demonstrated. Airways are patent. No pleural effusion. No pneumothorax.  Included portions of the upper abdominal organs are grossly unremarkable. Degenerative changes throughout the spine. No destructive bone lesions.  Review of the MIP images confirms the above findings.  IMPRESSION: No evidence of significant pulmonary embolus. Indeterminate mildly enlarged  lymph nodes in the mediastinum are probably reactive. Fibrosis or atelectasis in the lung bases.   Electronically Signed   By: Lucienne Capers M.D.   On: 12/12/2014 02:48    Scheduled Meds: . azithromycin  500 mg Oral Q24H  . cefTRIAXone (ROCEPHIN)  IV  1 g Intravenous Q24H  . folic acid  1 mg Oral Daily  . guaiFENesin  1,200 mg Oral BID  . heparin  5,000 Units Subcutaneous 3 times per day  . insulin aspart  0-15 Units Subcutaneous TID WC  . ipratropium  0.5 mg Nebulization BID  . levalbuterol  0.63 mg Nebulization BID  . metoprolol tartrate  25 mg Oral Daily  . predniSONE  60 mg Oral Q breakfast  . rosuvastatin  5 mg Oral q1800   Continuous Infusions:    Principal Problem:   Acute  respiratory failure with hypoxia Active Problems:   COPD exacerbation   Rheumatoid arthritis   DM2 (diabetes mellitus, type 2)   CAD (coronary artery disease)   Hyperlipidemia    Time spent: 33 minutes    THOMPSON,DANIEL M.D. [facility] Pager [phone]. If 7PM-7AM, please contact night-coverage at [email or web address], password [facility] 12/13/2014, 6:44 PM  LOS: 1 day

## 2014-12-13 NOTE — Progress Notes (Signed)
Patient ambulated in the hall without oxygen, the values varied from 88% to 98% RA.  Deep breathing helped the sats to be above 92%.

## 2014-12-13 NOTE — Progress Notes (Signed)
Utilization review completed. Brecken Walth, RN, BSN. 

## 2014-12-13 NOTE — Evaluation (Signed)
Physical Therapy Evaluation Patient Details Name: Jorge Malone MRN: [id number] DOB: 27-Sep-1940 Today's Date: 12/13/2014   History of Present Illness  74 y.o. male with history of rhematoid arthritis and DM2 admitted for COPD exacerbation causing acute respiratory failure with hypoxia  Clinical Impression  Patient evaluated by Physical Therapy with no further acute PT needs identified. All education has been completed and the patient has no further questions. Tolerated dynamic gait challenges with minimal gait/balance deficits. SpO2 90% while ambulating on room air - 92-97% at rest on room air. States he plans to exercise with his grandchild and stop smoking when he returns home, as he feels he has "let [himself] go" since retiring recently. See below for any follow-up Physial Therapy or equipment needs. PT is signing off. Thank you for this referral.     Follow Up Recommendations No PT follow up    Equipment Recommendations  None recommended by PT    Recommendations for Other Services       Precautions / Restrictions Precautions Precautions: None Restrictions Weight Bearing Restrictions: No      Mobility  Bed Mobility Overal bed mobility: Independent                Transfers Overall transfer level: Modified independent Equipment used: None             General transfer comment: No dizziness reported upon standing x2 from bed.  Ambulation/Gait Ambulation/Gait assistance: Supervision Ambulation Distance (Feet): 250 Feet Assistive device: None Gait Pattern/deviations: Step-through pattern;Decreased stride length;Wide base of support     General Gait Details: Widened base of support. Tolerated dynamic gait challenges with minimal deviation of balance from straight gait path. Did not require physical assist to complete high marching, quick turns, backwards stepping, or vertical/horizontal head turns. pt denies any dizzines. SpO2 lowest at 90% on room air.  Intermittent cough noted.  Stairs            Wheelchair Mobility    Modified Rankin (Stroke Patients Only)       Balance Overall balance assessment: Needs assistance;History of Falls Sitting-balance support: No upper extremity supported Sitting balance-Leahy Scale: Normal     Standing balance support: No upper extremity supported Standing balance-Leahy Scale: Good                               Pertinent Vitals/Pain Pain Assessment: No/denies pain    Home Living Family/patient expects to be discharged to:: Private residence Living Arrangements: Children Available Help at Discharge: Family Type of Home: House Home Access: Level entry     Home Layout: One level Home Equipment: None      Prior Function Level of Independence: Independent               Hand Dominance   Dominant Hand: Right    Extremity/Trunk Assessment   Upper Extremity Assessment: Defer to OT evaluation           Lower Extremity Assessment: Overall WFL for tasks assessed         Communication   Communication: No difficulties  Cognition Arousal/Alertness: Awake/alert Behavior During Therapy: WFL for tasks assessed/performed Overall Cognitive Status: Within Functional Limits for tasks assessed                      General Comments General comments (skin integrity, edema, etc.): Pt initially complaining of dizziness that has resulted in several falls over the past several  months. This occurs when he has bent over to adjust his sock or shoe and occurs when he is supine in bed, rolling to the left or transitions to a seated position. I assessed his horizontal and posterior canals with roll and dix hallpike tests. Pt denied any symptoms and no nystagmus noted however cervical ROM is severely limited, especially with rotation BIL. When performing forward flexion of waist to mimic putting socks on, pt reports dizziness similar to the feeling he experienced with prior  falls. Likely a result of intra-abdominal pressure due to body habitus. Symptoms resolve in <30 seconds. No symptoms upon standing. Educated to lose weight with healthy diet and exercise. Agrees this would be beneficial.    Exercises        Assessment/Plan    PT Assessment Patent does not need any further PT services  PT Diagnosis Abnormality of gait   PT Problem List    PT Treatment Interventions     PT Goals (Current goals can be found in the Care Plan section) Acute Rehab PT Goals Patient Stated Goal: none stated PT Goal Formulation: All assessment and education complete, DC therapy    Frequency     Barriers to discharge        Co-evaluation               End of Session   Activity Tolerance: Patient tolerated treatment well Patient left: in chair;with call bell/phone within reach Nurse Communication: Mobility status         Time: 1431-1459 PT Time Calculation (min) (ACUTE ONLY): 28 min   Charges:   PT Evaluation $Initial PT Evaluation Tier I: 1 Procedure PT Treatments $Gait Training: 8-22 mins   PT G CodesEllouise Newer 12/13/2014, 3:58 PM Camille Bal [place], [place]

## 2014-12-13 NOTE — Hospital Discharge Follow-Up (Signed)
Call received from [facility], RN CM that hospital follow-up appointment needed. Patient from [place] and does not have a PCP in the area.  Appointment obtained on 12/20/14 at 1545 with Dr. Jarold Song. Appointment placed on AVS. Jasmine Pang, RN CM updated.

## 2014-12-14 DIAGNOSIS — E8770 Fluid overload, unspecified: Secondary | ICD-10-CM

## 2014-12-14 LAB — GLUCOSE, CAPILLARY
GLUCOSE-CAPILLARY: 109 mg/dL — AB (ref 65–99)
Glucose-Capillary: 165 mg/dL — ABNORMAL HIGH (ref 65–99)
Glucose-Capillary: 91 mg/dL (ref 65–99)
Glucose-Capillary: 181 mg/dL — ABNORMAL HIGH (ref 65–99)
Glucose-Capillary: 131 mg/dL — ABNORMAL HIGH (ref 65–99)

## 2014-12-14 LAB — CBC
HCT: 39.2 % (ref 39.0–52.0)
HEMOGLOBIN: 12.5 g/dL — AB (ref 13.0–17.0)
MCH: 29.8 pg (ref 26.0–34.0)
MCHC: 31.9 g/dL (ref 30.0–36.0)
MCV: 93.6 fL (ref 78.0–100.0)
PLATELETS: 236 10*3/uL (ref 150–400)
RBC: 4.19 MIL/uL — AB (ref 4.22–5.81)
RDW: 13.8 % (ref 11.5–15.5)
WBC: 11.5 10*3/uL — AB (ref 4.0–10.5)

## 2014-12-14 LAB — BASIC METABOLIC PANEL
Anion gap: 7 (ref 5–15)
BUN: 15 mg/dL (ref 6–20)
CHLORIDE: 108 mmol/L (ref 101–111)
CO2: 27 mmol/L (ref 22–32)
Calcium: 9.2 mg/dL (ref 8.9–10.3)
Creatinine, Ser: 1.2 mg/dL (ref 0.61–1.24)
GFR calc Af Amer: >60 mL/min (ref >60)
GFR calc non Af Amer: 58 mL/min — ABNORMAL LOW (ref >60)
GLUCOSE: 103 mg/dL — AB (ref 65–99)
POTASSIUM: 3.6 mmol/L (ref 3.5–5.1)
SODIUM: 142 mmol/L (ref 135–145)

## 2014-12-14 LAB — CULTURE, GROUP A STREP

## 2014-12-14 MED ORDER — HYDROCHLOROTHIAZIDE 25 MG PO TABS
25.0000 mg | ORAL_TABLET | Freq: Every day | ORAL | Status: DC
  Administered 2014-12-15: 25 mg via ORAL
  Filled 2014-12-14: qty 1

## 2014-12-14 MED ORDER — BUDESONIDE-FORMOTEROL FUMARATE 160-4.5 MCG/ACT IN AERO
2.0000 | INHALATION_SPRAY | Freq: Two times a day (BID) | RESPIRATORY_TRACT | Status: DC
  Administered 2014-12-14 – 2014-12-15 (×2): 2 via RESPIRATORY_TRACT
  Filled 2014-12-14 (×2): qty 6

## 2014-12-14 MED ORDER — POTASSIUM CHLORIDE CRYS ER 20 MEQ PO TBCR
40.0000 meq | EXTENDED_RELEASE_TABLET | Freq: Once | ORAL | Status: AC
  Administered 2014-12-14: 40 meq via ORAL
  Filled 2014-12-14: qty 2

## 2014-12-14 MED ORDER — PREDNISONE 20 MG PO TABS
40.0000 mg | ORAL_TABLET | Freq: Every day | ORAL | Status: DC
  Administered 2014-12-15: 40 mg via ORAL
  Filled 2014-12-14 (×2): qty 2

## 2014-12-14 NOTE — Progress Notes (Signed)
[facility] PROGRESS NOTE  Jorge Malone BOF:[id number] DOB: November 12, 1940 DOA: 12/11/2014 PCP: No primary care provider on file.  Assessment/Plan: #1 acute respiratory failure with hypoxia secondary to acute COPD exacerbation Patient with clinical improvement. Repeat chest x-ray with some volume overload which was not present on admitting chest x-ray, likely secondary to fluid resuscitation and holding patient's diuretics. Urine strep pneumococcus antigen is negative. Blood cultures pending. Sputum Gram stain and cultures pending. Rapid strep screen was negative. Patient was +1.9 L during this hospitalization yesterday. Patient has been given IV Lasix and was -2.16 L over the past 24 hours. Saline lock IV fluids. Continue prednisone, scheduled nebs, Levaquin. Continue IV Lasix through today and resume home regimen of HCTZ tomorrow.   #2 Volume overload Per chest x-ray. Likely secondary to aggressive fluid resuscitation and holding patient's HCTZ. Patient denies any chest pain. Patient states feeling better than on admission. Patient given IV Lasix overnight and patient currently -2.1 L over the past 24 hours. Will check a 2-D echo. Will resume patient back on home regimen of HCTZ tomorrow.  #3 history of rheumatoid arthritis Embril on hold. Continue current dose of prednisone and will taper down to home dose.  #4 diabetes mellitus type 2 Oral hypoglycemics on hold. CBGs have ranged from 91-165. Sliding scale insulin.  #5 coronary artery disease Stable. Continue beta blocker. HCTZ was held on admission. Continue statin. Check a 2-D echo.  #6 hyperlipidemia Continue statin.  #7 prophylaxis Heparin for DVT prophylaxis.  Code Status: Full Family Communication: Updated patient. Updated daughter via telephone. Disposition Plan: Home when medically stable, hopefully 1-2 days.   Consultants:  None  Procedures:  Chest x-ray 12/13/2014  Antibiotics:  IV Rocephin 12/12/2014>>>  12/13/2014  Oral azithromycin 12/12/2014>>>> 12/13/2014  Oral Levaquin 12/13/2014  HPI/Subjective: Patient states feeling better. Patient complaining of cough. Patient states shortness of breath has improved. No chest pain.  Objective: Filed Vitals:   12/14/14 0922  BP: 125/68  Pulse: 77  Temp: 98 F (36.7 C)  Resp: 19    Intake/Output Summary (Last 24 hours) at 12/14/14 1100 Last data filed at 12/14/14 0923  Gross per 24 hour  Intake    960 ml  Output   4151 ml  Net  -3191 ml   Filed Weights   12/13/14 2030  Weight: 107.956 kg (238 lb)    Exam:   General:  NAD  Cardiovascular: RRR  Respiratory: Minimal expiratory wheezing no rhonchi. Some scattered coarse breath sounds.  Abdomen: Obese, soft, nontender, nondistended, positive bowel sounds.  Musculoskeletal: No clubbing cyanosis or edema.  Data Reviewed: Basic Metabolic Panel:  Recent Labs Lab 12/11/14 2037 12/13/14 0404 12/14/14 0419  NA 138 140 142  K 3.5 4.1 3.6  CL 105 111 108  CO2 24 24 27   GLUCOSE 142* 141* 103*  BUN 7 11 15   CREATININE 1.41* 0.92 1.20  CALCIUM 9.1 9.1 9.2   Liver Function Tests: No results for input(s): AST, ALT, ALKPHOS, BILITOT, PROT, ALBUMIN in the last 168 hours. No results for input(s): LIPASE, AMYLASE in the last 168 hours. No results for input(s): AMMONIA in the last 168 hours. CBC:  Recent Labs Lab 12/11/14 2037 12/13/14 0404 12/14/14 0419  WBC 9.3 10.8* 11.5*  HGB 13.0 12.0* 12.5*  HCT 40.8 37.8* 39.2  MCV 93.2 93.1 93.6  PLT 238 203 236   Cardiac Enzymes: No results for input(s): CKTOTAL, CKMB, CKMBINDEX, TROPONINI in the last 168 hours. BNP (last 3 results) No results for input(s): BNP  in the last 8760 hours.  ProBNP (last 3 results) No results for input(s): PROBNP in the last 8760 hours.  CBG:  Recent Labs Lab 12/13/14 0803 12/13/14 1201 12/13/14 1614 12/13/14 2029 12/14/14 0743  GLUCAP 102* 106* 161* 165* 91    Recent Results (from  the past 240 hour(s))  Rapid strep screen (not at [facility])     Status: None   Collection Time: 12/11/14 11:55 PM  Result Value Ref Range Status   Streptococcus, Group A Screen (Direct) NEGATIVE NEGATIVE Final    Comment: (NOTE) A Rapid Antigen test may result negative if the antigen level in the sample is below the detection level of this test. The FDA has not cleared this test as a stand-alone test therefore the rapid antigen negative result has reflexed to a Group A Strep culture.   Culture, Group A Strep     Status: None   Collection Time: 12/11/14 11:55 PM  Result Value Ref Range Status   Strep A Culture Comment  Final    Comment: (NOTE) Beta-hemolytic Streptococcus, not group A isolated. Culture reincubated. Performed At: [facility] [place], [place] [id number] Lindon Romp MD [id number]   Culture, blood (routine x 2) Call MD if unable to obtain prior to antibiotics being given     Status: None (Preliminary result)   Collection Time: 12/12/14  3:38 AM  Result Value Ref Range Status   Specimen Description BLOOD RIGHT ARM  Final   Special Requests   Final    BOTTLES DRAWN AEROBIC AND ANAEROBIC 10CC AEROBIC 5CC ANAEROBIC   Culture NO GROWTH 1 DAY  Final   Report Status PENDING  Incomplete  Culture, blood (routine x 2) Call MD if unable to obtain prior to antibiotics being given     Status: None (Preliminary result)   Collection Time: 12/12/14  3:45 AM  Result Value Ref Range Status   Specimen Description BLOOD RIGHT HAND  Final   Special Requests BOTTLES DRAWN AEROBIC ONLY 5CC  Final   Culture NO GROWTH 1 DAY  Final   Report Status PENDING  Incomplete     Studies: Dg Chest 2 View  12/13/2014   CLINICAL DATA:  Hypoxia  EXAM: CHEST  2 VIEW  COMPARISON:  12/11/2014  FINDINGS: Cardiac enlargement. Progressive vascular congestion and interstitial edema compared to the prior study. Small left effusion has developed. Findings consistent with fluid  overload.  IMPRESSION: Progression of congestive heart failure with interstitial edema and small left effusion   Electronically Signed   By: Franchot Gallo M.D.   On: 12/13/2014 14:46    Scheduled Meds: . folic acid  1 mg Oral Daily  . guaiFENesin  1,200 mg Oral BID  . heparin  5,000 Units Subcutaneous 3 times per day  . insulin aspart  0-15 Units Subcutaneous TID WC  . ipratropium  0.5 mg Nebulization BID  . levalbuterol  0.63 mg Nebulization BID  . levofloxacin  500 mg Oral Daily  . metoprolol tartrate  25 mg Oral Daily  . predniSONE  60 mg Oral Q breakfast  . rosuvastatin  5 mg Oral q1800   Continuous Infusions:    Principal Problem:   Acute respiratory failure with hypoxia Active Problems:   COPD exacerbation   Rheumatoid arthritis   DM2 (diabetes mellitus, type 2)   CAD (coronary artery disease)   Hyperlipidemia   Volume overload    Time spent: 70 minutes    THOMPSON,DANIEL M.D. [facility] Pager [phone].  If 7PM-7AM, please contact night-coverage at [email or web address], password [facility] 12/14/2014, 11:00 AM  LOS: 2 days

## 2014-12-15 ENCOUNTER — Inpatient Hospital Stay (HOSPITAL_COMMUNITY): Payer: Medicare Other

## 2014-12-15 DIAGNOSIS — R0600 Dyspnea, unspecified: Secondary | ICD-10-CM

## 2014-12-15 LAB — BASIC METABOLIC PANEL
ANION GAP: 7 (ref 5–15)
BUN: 17 mg/dL (ref 6–20)
CHLORIDE: 103 mmol/L (ref 101–111)
CO2: 27 mmol/L (ref 22–32)
Calcium: 9 mg/dL (ref 8.9–10.3)
Creatinine, Ser: 1.08 mg/dL (ref 0.61–1.24)
Glucose, Bld: 122 mg/dL — ABNORMAL HIGH (ref 65–99)
POTASSIUM: 4 mmol/L (ref 3.5–5.1)
SODIUM: 137 mmol/L (ref 135–145)

## 2014-12-15 LAB — GLUCOSE, CAPILLARY: Glucose-Capillary: 88 mg/dL (ref 65–99)

## 2014-12-15 MED ORDER — PREDNISONE 10 MG PO TABS: 40.0000 mg | ORAL_TABLET | Freq: Every day | ORAL | Status: DC

## 2014-12-15 MED ORDER — LEVALBUTEROL HCL 0.63 MG/3ML IN NEBU: 0.6300 mg | INHALATION_SOLUTION | Freq: Four times a day (QID) | RESPIRATORY_TRACT | Status: AC | PRN

## 2014-12-15 MED ORDER — LEVOFLOXACIN 500 MG PO TABS: 500.0000 mg | ORAL_TABLET | Freq: Every day | ORAL | Status: DC

## 2014-12-15 MED ORDER — BUDESONIDE-FORMOTEROL FUMARATE 160-4.5 MCG/ACT IN AERO: 2.0000 | INHALATION_SPRAY | Freq: Two times a day (BID) | RESPIRATORY_TRACT | Status: DC

## 2014-12-15 MED ORDER — TIOTROPIUM BROMIDE MONOHYDRATE 18 MCG IN CAPS: 18.0000 ug | ORAL_CAPSULE | Freq: Every day | RESPIRATORY_TRACT | Status: DC

## 2014-12-15 MED ORDER — PREDNISONE 5 MG PO TABS: 5.0000 mg | ORAL_TABLET | Freq: Every day | ORAL | Status: DC

## 2014-12-15 MED ORDER — GUAIFENESIN ER 600 MG PO TB12: 1200.0000 mg | ORAL_TABLET | Freq: Two times a day (BID) | ORAL | Status: DC

## 2014-12-15 NOTE — Progress Notes (Signed)
Patient discharged home per MD. Reviewed discharge instructions. Provided scripts. Escorted out via wheelchair and Agricultural consultant. Bartholomew Crews, RN

## 2014-12-15 NOTE — Discharge Summary (Signed)
Physician Discharge Summary  Jorge Malone HKV:[id number] DOB: Dec 21, 1940 DOA: 12/11/2014  PCP: No primary care provider on file.  Admit date: 12/11/2014 Discharge date: 12/15/2014  Time spent: 65 minutes  Recommendations for Outpatient Follow-up:  1. Patient be discharged home. Patient is to follow-[facility] on the [facility] on 12/20/2014 at 3:45 PM for hospital follow-up. On follow-up patient need a basic metabolic profile done to follow-up on electrolytes and renal function. CBC also need to be done to follow-up on H&H. Patient will likely benefit from outpatient referral to pulmonary, rheumatology and possibly cardiology to establish care as patient recently moved down from [place].  Discharge Diagnoses:  Principal Problem:   Acute respiratory failure with hypoxia Active Problems:   COPD exacerbation   Rheumatoid arthritis   DM2 (diabetes mellitus, type 2)   CAD (coronary artery disease)   Hyperlipidemia   Volume overload   Discharge Condition: Stable and improved  Diet recommendation: Heart healthy  Filed Weights   12/13/14 2030 12/14/14 2136  Weight: 107.956 kg (238 lb) 105.552 kg (232 lb 11.2 oz)    History of present illness:  Per Dr. Frederik Schmidt Coy is a 74 y.o. male with h/o COPD, presented to ED with c/o productive cough onset on 6/26. There had been associated wheezing, chest tightness, SOB, fever, headache. Inhaler not providing relief. Is on immunosuppressive embril for his chronic rheumatoid arthritis as well as chronic daily prednisone. Not on O2 at home.  Hospital Course:  #1 acute respiratory failure with hypoxia secondary to acute COPD exacerbation Patient was admitted with acute respiratory failure with hypoxia felt secondary to an acute COPD exacerbation. Chest x-ray done on admission was negative for any acute infiltrate. Patient was placed empirically on IV Rocephin and azithromycin as well as oxygen, steroids, nebulizer treatments, Mucinex.  Patient improved clinically during the hospitalization and steroids were tapered down. Repeat chest x-ray with some volume overload which was not present on admitting chest x-ray, likely secondary to fluid resuscitation and holding patient's diuretics. Urine strep pneumococcus antigen was negative. Blood cultures pending. Sputum Gram stain and cultures pending. Rapid strep screen was negative. Patient was +1.9 L during this hospitalization. Patient was given 2 doses of IV Lasix and diuresed 1.56 L during the hospitalization with clinical improvement. Patient was subsequently transitioned back to his home regimen of HCTZ. Patient was maintained on scheduled nebulizers as well as prednisone taper. Patient was also placed on Mucinex and nebulizer treatments. IV antibiotics were transitioned to oral Levaquin and patient be discharged home on 4 more days of oral Levaquin to complete a one-week course of antibiotic therapy. Patient be discharged in stable and improved condition and will follow-up at the community [facility].   #2 Volume overload On admission chest x-ray which was done was unremarkable. During the hospitalization due to patient's hypoxia repeat chest x-ray was done which showed volume overload. It was felt volume overload noted on chest x-ray was likely secondary to aggressive fluid resuscitation and holding patient's HCTZ. Patient denied any chest pain. Patient stated shortness of breath improved on a daily basis during the hospitalization. Patient was given 2 doses of IV Lasix and was -1.56 L during the hospitalization. 2-D echo was obtained which showed moderate LVH with an ejection fraction of 60-65% with no wall motion abnormalities and grade 1 diastolic dysfunction. Patient improved clinically and patient's HCTZ was resumed. Patient will be discharged home in stable and improved condition and will need to establish care with a cardiologist and PCP  as outpatient.   #3 history of rheumatoid  arthritis Embril was held during the hospitalization and will be resumed on discharge. Patient was placed on steroids taper on admission for acute COPD exacerbation. Patients chronic home dose of prednisone 5 mg daily was discontinued during the hospitalization. Patient will be discharged home on the steroids taper back down to his home regimen. Patient will need to establish care with a rheumatologist that she's recently moved down from [place].   #4 diabetes mellitus type 2 Patient's oral hypoglycemic agents were held during the hospitalization. Patient's blood glucose levels remained stable. Patient was maintained on a sliding scale insulin. Outpatient follow-up.   #5 coronary artery disease Stable. Continued on beta blocker. HCTZ was held on admission and resumed during the hospitalization. Patient was maintained on a statin. 2-D echo was obtained which showed moderate LVH, ejection fraction of 60-65% with no wall motion abnormalities grade 1 diastolic dysfunction. Left atrium was normal in size. Patient on need to follow-up with a cardiologist as outpatient as he's recently moved down from [place].  #6 hyperlipidemia Continued on statin.   Procedures:  Chest x-ray 12/11/2014, 12/13/2014, 12/15/2014  2-D echo 12/15/2014  CT angiogram chest 12/12/2014  Consultations:  None  Discharge Exam: Filed Vitals:   12/15/14 1402  BP: 122/68  Pulse: 76  Temp: 98.7 F (37.1 C)  Resp: 18    General: NAD Cardiovascular: RRR Respiratory: CTAB. No crackles.  Discharge Instructions   Discharge Instructions    Diet - low sodium heart healthy    Complete by:  As directed      Discharge instructions    Complete by:  As directed   Follow up as scheduled in clinic for HFU. STOP SMOKING     Increase activity slowly    Complete by:  As directed           Current Discharge Medication List    START taking these medications   Details  budesonide-formoterol (SYMBICORT) 160-4.5  MCG/ACT inhaler Inhale 2 puffs into the lungs 2 (two) times daily. Qty: 1 Inhaler, Refills: 0    guaiFENesin (MUCINEX) 600 MG 12 hr tablet Take 2 tablets (1,200 mg total) by mouth 2 (two) times daily. Take for 4 days then stop. Qty: 16 tablet, Refills: 0    levalbuterol (XOPENEX) 0.63 MG/3ML nebulizer solution Take 3 mLs (0.63 mg total) by nebulization every 6 (six) hours as needed for wheezing or shortness of breath. Use 3 times daily x 4 days, then every 6 hours as needed for SOB. Qty: 360 mL, Refills: 3    levofloxacin (LEVAQUIN) 500 MG tablet Take 1 tablet (500 mg total) by mouth daily. Take for 4 days then stop. Qty: 4 tablet, Refills: 0    tiotropium (SPIRIVA HANDIHALER) 18 MCG inhalation capsule Place 1 capsule (18 mcg total) into inhaler and inhale daily. Qty: 30 capsule, Refills: 2      CONTINUE these medications which have CHANGED   Details  !! predniSONE (DELTASONE) 10 MG tablet Take 4 tablets (40 mg total) by mouth daily with breakfast. Take 4 tablets (40mg ) daily x 2 days, then 2 tablets (20mg ) x 3 days, then 1 tablet (10mg ) x 3 days,  Then back to home dose of 5 mg daily. Qty: 17 tablet, Refills: 0    !! predniSONE (DELTASONE) 5 MG tablet Take 1 tablet (5 mg total) by mouth daily with breakfast.     !! - Potential duplicate medications found. Please discuss with provider.  CONTINUE these medications which have NOT CHANGED   Details  etanercept (ENBREL SURECLICK) 50 MG/ML injection Inject 50 mg into the skin once a week. On Thursdays    fenofibrate (TRICOR) 48 MG tablet Take 48 mg by mouth daily.    folic acid (FOLVITE) 1 MG tablet Take 1 mg by mouth daily.    hydrochlorothiazide (HYDRODIURIL) 25 MG tablet Take 25 mg by mouth daily.    metFORMIN (GLUCOPHAGE) 1000 MG tablet Take 1,000 mg by mouth daily with breakfast.    metoprolol tartrate (LOPRESSOR) 25 MG tablet Take 25 mg by mouth daily.    rosuvastatin (CRESTOR) 5 MG tablet Take 5 mg by mouth daily.        Allergies  Allergen Reactions  . Amoxicillin     Pt tolerates cephalosporins   Follow-up Information    Please follow up.   Why:  Call [facility] @ [phone] for a listing of local doctors accepting new patients.       Follow up with [facility]     On 12/20/2014.   Why:  Appointment on 12/20/14 at 3:45 pm with Dr. Jarold Song.   Contact information:   [address] [place] [address] [phone]       The results of significant diagnostics from this hospitalization (including imaging, microbiology, ancillary and laboratory) are listed below for reference.    Significant Diagnostic Studies: Dg Chest 2 View  12/15/2014   CLINICAL DATA:  74 year old male with shortness of Breath for 2 weeks. Initial encounter. Current history of COPD.  EXAM: CHEST  2 VIEW  COMPARISON:  12/13/2014.  Chest CTA 12/12/2014. Initial encounter.  FINDINGS: Interval regressed bilateral pulmonary interstitial opacity. Stable lung volumes. Stable cardiomegaly and mediastinal contours. No pneumothorax. No pleural effusion. No areas of worsening ventilation. No acute osseous abnormality identified.  IMPRESSION: Decreased interstitial opacity, favor regression of interstitial edema. No new cardiopulmonary abnormality.   Electronically Signed   By: Genevie Ann M.D.   On: 12/15/2014 12:13   Dg Chest 2 View  12/13/2014   CLINICAL DATA:  Hypoxia  EXAM: CHEST  2 VIEW  COMPARISON:  12/11/2014  FINDINGS: Cardiac enlargement. Progressive vascular congestion and interstitial edema compared to the prior study. Small left effusion has developed. Findings consistent with fluid overload.  IMPRESSION: Progression of congestive heart failure with interstitial edema and small left effusion   Electronically Signed   By: Franchot Gallo M.D.   On: 12/13/2014 14:46   Dg Chest 2 View  12/11/2014   CLINICAL DATA:  Left-sided chest pain for 1 day  EXAM: CHEST - 2 VIEW  COMPARISON:  None.   FINDINGS: The heart size and mediastinal contours are within normal limits. Both lungs are clear. The visualized skeletal structures are unremarkable.  IMPRESSION: No active disease.   Electronically Signed   By: Inez Catalina M.D.   On: 12/11/2014 21:42   Ct Angio Chest Pe W/cm &/or Wo Cm  12/12/2014   CLINICAL DATA:  Left mid chest pain and shortness of breath starting at the end of June. Cough.  EXAM: CT ANGIOGRAPHY CHEST WITH CONTRAST  TECHNIQUE: Multidetector CT imaging of the chest was performed using the standard protocol during bolus administration of intravenous contrast. Multiplanar CT image reconstructions and MIPs were obtained to evaluate the vascular anatomy.  CONTRAST:  33mL OMNIPAQUE IOHEXOL 350 MG/ML SOLN  COMPARISON:  None.  FINDINGS: Technically adequate study with good opacification of the segmental and central pulmonary  arteries. No focal filling defect. No evidence of significant pulmonary embolus.  Normal heart size. Coronary artery calcification. Normal caliber thoracic aorta. Great vessel origins are patent. Esophagus is decompressed. There are scattered lymph nodes diffusely throughout the mediastinum and right infrahilar region. Largest lymph nodes measure up to about 12 mm short axis dimension. Appearance is nonspecific. These could represent reactive nodes although early lymphoma or neoplastic changes not excluded.  Evaluation of lungs is limited due to respiratory motion artifact. There is evidence of atelectasis or scarring in the lung bases. No distinct focal nodules or consolidation demonstrated. Airways are patent. No pleural effusion. No pneumothorax.  Included portions of the upper abdominal organs are grossly unremarkable. Degenerative changes throughout the spine. No destructive bone lesions.  Review of the MIP images confirms the above findings.  IMPRESSION: No evidence of significant pulmonary embolus. Indeterminate mildly enlarged lymph nodes in the mediastinum are  probably reactive. Fibrosis or atelectasis in the lung bases.   Electronically Signed   By: Lucienne Capers M.D.   On: 12/12/2014 02:48    Microbiology: Recent Results (from the past 240 hour(s))  Rapid strep screen (not at [facility])     Status: None   Collection Time: 12/11/14 11:55 PM  Result Value Ref Range Status   Streptococcus, Group A Screen (Direct) NEGATIVE NEGATIVE Final    Comment: (NOTE) A Rapid Antigen test may result negative if the antigen level in the sample is below the detection level of this test. The FDA has not cleared this test as a stand-alone test therefore the rapid antigen negative result has reflexed to a Group A Strep culture.   Culture, Group A Strep     Status: Abnormal   Collection Time: 12/11/14 11:55 PM  Result Value Ref Range Status   Strep A Culture Comment (A)  Final    Comment: (NOTE) Beta-hemolytic colonies, not group A Streptococcus isolated. Performed At: [facility] [place], [place] [id number] Lindon Romp MD HY:[id number]   Culture, blood (routine x 2) Call MD if unable to obtain prior to antibiotics being given     Status: None (Preliminary result)   Collection Time: 12/12/14  3:38 AM  Result Value Ref Range Status   Specimen Description BLOOD RIGHT ARM  Final   Special Requests   Final    BOTTLES DRAWN AEROBIC AND ANAEROBIC 10CC AEROBIC 5CC ANAEROBIC   Culture NO GROWTH 3 DAYS  Final   Report Status PENDING  Incomplete  Culture, blood (routine x 2) Call MD if unable to obtain prior to antibiotics being given     Status: None (Preliminary result)   Collection Time: 12/12/14  3:45 AM  Result Value Ref Range Status   Specimen Description BLOOD RIGHT HAND  Final   Special Requests BOTTLES DRAWN AEROBIC ONLY 5CC  Final   Culture NO GROWTH 3 DAYS  Final   Report Status PENDING  Incomplete     Labs: Basic Metabolic Panel:  Recent Labs Lab 12/11/14 2037 12/13/14 0404 12/14/14 0419 12/15/14 0421  NA 138 140  142 137  K 3.5 4.1 3.6 4.0  CL 105 111 108 103  CO2 24 24 27 27   GLUCOSE 142* 141* 103* 122*  BUN 7 11 15 17   CREATININE 1.41* 0.92 1.20 1.08  CALCIUM 9.1 9.1 9.2 9.0   Liver Function Tests: No results for input(s): AST, ALT, ALKPHOS, BILITOT, PROT, ALBUMIN in the last 168 hours. No results for input(s): LIPASE, AMYLASE in the last 168  hours. No results for input(s): AMMONIA in the last 168 hours. CBC:  Recent Labs Lab 12/11/14 2037 12/13/14 0404 12/14/14 0419  WBC 9.3 10.8* 11.5*  HGB 13.0 12.0* 12.5*  HCT 40.8 37.8* 39.2  MCV 93.2 93.1 93.6  PLT 238 203 236   Cardiac Enzymes: No results for input(s): CKTOTAL, CKMB, CKMBINDEX, TROPONINI in the last 168 hours. BNP: BNP (last 3 results) No results for input(s): BNP in the last 8760 hours.  ProBNP (last 3 results) No results for input(s): PROBNP in the last 8760 hours.  CBG:  Recent Labs Lab 12/14/14 0743 12/14/14 1158 12/14/14 1650 12/14/14 2133 12/15/14 0813  GLUCAP 91 109* 181* 131* 88       Signed:  Anh Bigos MD [facility] 12/15/2014, 4:14 PM

## 2014-12-17 LAB — LEGIONELLA ANTIGEN, URINE

## 2014-12-17 LAB — CULTURE, BLOOD (ROUTINE X 2)
Culture: NO GROWTH
Culture: NO GROWTH

## 2014-12-20 ENCOUNTER — Ambulatory Visit: Payer: Medicare Other | Attending: Family Medicine | Admitting: Family Medicine

## 2014-12-20 ENCOUNTER — Encounter: Payer: Self-pay | Admitting: Family Medicine

## 2014-12-20 VITALS — BP 114/76 | HR 86 | Temp 99.2°F | Ht 69.0 in | Wt 236.0 lb

## 2014-12-20 DIAGNOSIS — Z87891 Personal history of nicotine dependence: Secondary | ICD-10-CM | POA: Diagnosis not present

## 2014-12-20 DIAGNOSIS — J441 Chronic obstructive pulmonary disease with (acute) exacerbation: Secondary | ICD-10-CM | POA: Diagnosis not present

## 2014-12-20 DIAGNOSIS — E785 Hyperlipidemia, unspecified: Secondary | ICD-10-CM | POA: Insufficient documentation

## 2014-12-20 DIAGNOSIS — I2510 Atherosclerotic heart disease of native coronary artery without angina pectoris: Secondary | ICD-10-CM | POA: Insufficient documentation

## 2014-12-20 DIAGNOSIS — M069 Rheumatoid arthritis, unspecified: Secondary | ICD-10-CM | POA: Insufficient documentation

## 2014-12-20 DIAGNOSIS — E119 Type 2 diabetes mellitus without complications: Secondary | ICD-10-CM | POA: Diagnosis not present

## 2014-12-20 LAB — GLUCOSE, POCT (MANUAL RESULT ENTRY): POC GLUCOSE: 121 mg/dL — AB (ref 70–99)

## 2014-12-20 LAB — POCT GLYCOSYLATED HEMOGLOBIN (HGB A1C): Hemoglobin A1C: 6.8

## 2014-12-20 MED ORDER — METFORMIN HCL 1000 MG PO TABS: 1000.0000 mg | ORAL_TABLET | Freq: Every day | ORAL | Status: DC

## 2014-12-20 NOTE — Patient Instructions (Signed)
Diabetes Mellitus and Food It is important for you to manage your blood sugar (glucose) level. Your blood glucose level can be greatly affected by what you eat. Eating healthier foods in the appropriate amounts throughout the day at about the same time each day will help you control your blood glucose level. It can also help slow or prevent worsening of your diabetes mellitus. Healthy eating may even help you improve the level of your blood pressure and reach or maintain a healthy weight.  HOW CAN FOOD AFFECT ME? Carbohydrates Carbohydrates affect your blood glucose level more than any other type of food. Your dietitian will help you determine how many carbohydrates to eat at each meal and teach you how to count carbohydrates. Counting carbohydrates is important to keep your blood glucose at a healthy level, especially if you are using insulin or taking certain medicines for diabetes mellitus. Alcohol Alcohol can cause sudden decreases in blood glucose (hypoglycemia), especially if you use insulin or take certain medicines for diabetes mellitus. Hypoglycemia can be a life-threatening condition. Symptoms of hypoglycemia (sleepiness, dizziness, and disorientation) are similar to symptoms of having too much alcohol.  If your health care provider has given you approval to drink alcohol, do so in moderation and use the following guidelines:  Women should not have more than one drink per day, and men should not have more than two drinks per day. One drink is equal to:  12 oz of beer.  5 oz of wine.  1 oz of hard liquor.  Do not drink on an empty stomach.  Keep yourself hydrated. Have water, diet soda, or unsweetened iced tea.  Regular soda, juice, and other mixers might contain a lot of carbohydrates and should be counted. WHAT FOODS ARE NOT RECOMMENDED? As you make food choices, it is important to remember that all foods are not the same. Some foods have fewer nutrients per serving than other  foods, even though they might have the same number of calories or carbohydrates. It is difficult to get your body what it needs when you eat foods with fewer nutrients. Examples of foods that you should avoid that are high in calories and carbohydrates but low in nutrients include:  Trans fats (most processed foods list trans fats on the Nutrition Facts label).  Regular soda.  Juice.  Candy.  Sweets, such as cake, pie, doughnuts, and cookies.  Fried foods. WHAT FOODS CAN I EAT? Have nutrient-rich foods, which will nourish your body and keep you healthy. The food you should eat also will depend on several factors, including:  The calories you need.  The medicines you take.  Your weight.  Your blood glucose level.  Your blood pressure level.  Your cholesterol level. You also should eat a variety of foods, including:  Protein, such as meat, poultry, fish, tofu, nuts, and seeds (lean animal proteins are best).  Fruits.  Vegetables.  Dairy products, such as milk, cheese, and yogurt (low fat is best).  Breads, grains, pasta, cereal, rice, and beans.  Fats such as olive oil, trans fat-free margarine, canola oil, avocado, and olives. DOES EVERYONE WITH DIABETES MELLITUS HAVE THE SAME MEAL PLAN? Because every person with diabetes mellitus is different, there is not one meal plan that works for everyone. It is very important that you meet with a dietitian who will help you create a meal plan that is just right for you. Document Released: 02/11/2005 Document Revised: 05/22/2013 Document Reviewed: 04/13/2013 Exit[employer] Patient Information 2015 [employer]. This   information is not intended to replace advice given to you by your health care provider. Make sure you discuss any questions you have with your health care provider.  

## 2014-12-20 NOTE — Progress Notes (Signed)
Quick Note:  Labs addressed at office as well as medications and patient was made aware. ______ 

## 2014-12-20 NOTE — Progress Notes (Signed)
Patient recently moved from [facility] Patient in hospital for COPD and reports feeling better O2 today is 93% He has finished his antibiotics and has not restarted his Embrel.  He is on a 10mg  Prednisone taper to get him back down to his baseline daily 5 mg Prednisone dose Patient needs refill on Metformin Patient has not had a cigarette since 7/13.  He is wanting to stay abstinent and has tried Chantix but states it made him have crazy dreams and is not taking it.

## 2014-12-20 NOTE — Progress Notes (Signed)
Patient ID: Jorge Malone, male   DOB: 03-25-1941, 74 y.o.   MRN: [id number]    Jorge Malone, is a 74 y.o. male  SWF:[id number]  UKG:[id number]  DOB - 01-12-1941  CC:  Chief Complaint  Patient presents with  . Hospitalization Follow-up  . Establish Care  . COPD  . Hyperlipidemia  . Diabetes       HPI: Jorge Malone is a 74 y.o. male with a history of Rheumatoid Arthritis, Hyperlipidemia, Hypertension, Type 2 DM, CAD s/p stent who recently relocated from [place] and was admitted at [facility] 7/13-7/17 for acute respiratory failure with hypoxia secondary to COPD exacerbation. He had presented to the ED with dyspnea, fever, headache. CXR was negative for infiltrates but he was placed on empiric Rocephin and Azithromycin, Oxygen, steroids, nebulizer treatments.Blood cultures, urine cultures, rapid strept were all negative, Group A strep culture revealed beta hemolytic colonies. Repeat CXR revealed evidence of fluid overload which was thought to be secondary to initial resuscitation and so he was diuresed. His condition improved and was transitioned to oral Levaquin prior to discharge.  Interval History: He reports feeling well and has no complaints today. He is not up to date on his annual eye exam and is refusing a Pneumonia shot. He will also need a referral to a Rheumatologist and Cardiologist.  Allergies  Allergen Reactions  . Amoxicillin     Pt tolerates cephalosporins   Past Medical History  Diagnosis Date  . Diabetes mellitus without complication   . Rheumatoid arthritis   . High cholesterol   . Coronary artery disease   . Hyperlipidemia 12/13/2014   Current Outpatient Prescriptions on File Prior to Visit  Medication Sig Dispense Refill  . budesonide-formoterol (SYMBICORT) 160-4.5 MCG/ACT inhaler Inhale 2 puffs into the lungs 2 (two) times daily. 1 Inhaler 0  . fenofibrate (TRICOR) 48 MG tablet Take 48 mg by mouth daily.    . folic acid (FOLVITE) 1 MG  tablet Take 1 mg by mouth daily.    . hydrochlorothiazide (HYDRODIURIL) 25 MG tablet Take 25 mg by mouth daily.    Marland Kitchen levalbuterol (XOPENEX) 0.63 MG/3ML nebulizer solution Take 3 mLs (0.63 mg total) by nebulization every 6 (six) hours as needed for wheezing or shortness of breath. Use 3 times daily x 4 days, then every 6 hours as needed for SOB. 360 mL 3  . metFORMIN (GLUCOPHAGE) 1000 MG tablet Take 1,000 mg by mouth daily with breakfast.    . metoprolol tartrate (LOPRESSOR) 25 MG tablet Take 25 mg by mouth daily.    . predniSONE (DELTASONE) 10 MG tablet Take 4 tablets (40 mg total) by mouth daily with breakfast. Take 4 tablets (40mg ) daily x 2 days, then 2 tablets (20mg ) x 3 days, then 1 tablet (10mg ) x 3 days,  Then back to home dose of 5 mg daily. 17 tablet 0  . rosuvastatin (CRESTOR) 5 MG tablet Take 5 mg by mouth daily.    Marland Kitchen tiotropium (SPIRIVA HANDIHALER) 18 MCG inhalation capsule Place 1 capsule (18 mcg total) into inhaler and inhale daily. 30 capsule 2  . etanercept (ENBREL SURECLICK) 50 MG/ML injection Inject 50 mg into the skin once a week. On Thursdays    . guaiFENesin (MUCINEX) 600 MG 12 hr tablet Take 2 tablets (1,200 mg total) by mouth 2 (two) times daily. Take for 4 days then stop. (Patient not taking: Reported on 12/20/2014) 16 tablet 0  . levofloxacin (LEVAQUIN) 500 MG tablet Take 1 tablet (500 mg  total) by mouth daily. Take for 4 days then stop. (Patient not taking: Reported on 12/20/2014) 4 tablet 0  . [START ON 12/23/2014] predniSONE (DELTASONE) 5 MG tablet Take 1 tablet (5 mg total) by mouth daily with breakfast. (Patient not taking: Reported on 12/20/2014)     No current facility-administered medications on file prior to visit.   History reviewed. No pertinent family history. History   Social History  . Marital Status: Single    Spouse Name: N/A  . Number of Children: N/A  . Years of Education: N/A   Occupational History  . Not on file.   Social History Main Topics  .  Smoking status: Former Smoker    Quit date: 12/11/2014  . Smokeless tobacco: Not on file  . Alcohol Use: Yes     Comment: occasional  . Drug Use: No  . Sexual Activity: Not on file   Other Topics Concern  . Not on file   Social History Narrative    Review of Systems: Constitutional: Negative for fever, chills, diaphoresis, activity change, appetite change and fatigue. HENT: Negative for ear pain, nosebleeds, congestion, facial swelling, rhinorrhea, neck pain, neck stiffness and ear discharge.  Eyes: Negative for pain, discharge, redness, itching and visual disturbance. Respiratory: Negative for cough, choking, chest tightness, shortness of breath, wheezing and stridor.  Cardiovascular: Negative for chest pain, palpitations and leg swelling. Gastrointestinal: Negative for abdominal distention. Genitourinary: Negative for dysuria, urgency, frequency, hematuria, flank pain, decreased urine volume, difficulty urinating and dyspareunia.  Musculoskeletal: Negative for back pain, joint swelling, arthralgia and gait problem. Neurological: Negative for dizziness, tremors, seizures, syncope, facial asymmetry, speech difficulty, weakness, light-headedness, numbness and headaches.  Hematological: Negative for adenopathy. Does not bruise/bleed easily. Skin: Negative for rash, ulcer. Psychiatric/Behavioral: Negative for hallucinations, behavioral problems, confusion, dysphoric mood, decreased concentration and agitation.    Objective:   Filed Vitals:   12/20/14 1557  BP: 114/76  Pulse: 86  Temp: 99.2 F (37.3 C)    Physical Exam: Constitutional: Patient appears well-developed and well-nourished. No distress. HENT: Normocephalic, atraumatic, External right and left ear normal. Oropharynx is clear and moist.  Eyes: Conjunctivae and EOM are normal. PERRLA, no scleral icterus. Neck: Normal ROM, No JVD. No tracheal deviation. No thyromegaly. CVS: RRR, S1/S2 +, no murmurs, no gallops, no  carotid bruit.  Pulmonary: Effort and breath sounds normal, no stridor, rhonchi, wheezes, rales.  Abdominal: Soft. BS +, no distension, tenderness, rebound or guarding.  Musculoskeletal: Normal range of motion. No edema and no tenderness.  Lymphadenopathy: No lymphadenopathy noted, cervical, inguinal or axillary Neuro: Alert. Normal reflexes, muscle tone coordination. No cranial nerve deficit. Skin: Skin is warm and dry. No rash noted. Not diaphoretic. No erythema. No pallor. Psychiatric: Normal mood and affect. Behavior, judgment, thought content normal.  Lab Results  Component Value Date   WBC 11.5* 12/14/2014   HGB 12.5* 12/14/2014   HCT 39.2 12/14/2014   MCV 93.6 12/14/2014   PLT 236 12/14/2014   Lab Results  Component Value Date   CREATININE 1.08 12/15/2014   BUN 17 12/15/2014   NA 137 12/15/2014   K 4.0 12/15/2014   CL 103 12/15/2014   CO2 27 12/15/2014    Lab Results  Component Value Date   HGBA1C 6.80 12/20/2014   Lipid Panel  No results found for: CHOL, TRIG, HDL, CHOLHDL, VLDL, LDLCALC     Assessment and plan:  74 year old male with a history of Rheumatoid Arthritis, Hyperlipidemia, Hypertension, Type 2 DM, CAD s/p  stent here for a hospital follow up.  COPD: Acute exacerbation resolved, oxygen saturation is still on the low side. Continue medications.  Type 2 DM Controlled with Hba1c of 6.8 Referred to Opthalmology for annual eye exams    Hypertension: Controlled  CAD s/p stent: Not currently on antiplatelet therapy. Will refer to Cardiology for optimization.  Hyperlipidemia: Remains on a statin  Rheumatology: Continue Embrel and advised to return to 5mg  daily dose of Prednisone once Prednisone taper has been completed.  The patient was given clear instructions to go to ER or return to medical center if symptoms don't improve, worsen or new problems develop. The patient verbalized understanding. The patient was told to call to get lab results if  they haven't heard anything in the next week.     Arnoldo Morale, [facility] [phone] 12/20/2014, 4:12 PM

## 2015-01-02 ENCOUNTER — Emergency Department (HOSPITAL_COMMUNITY)
Admission: EM | Admit: 2015-01-02 | Discharge: 2015-01-02 | Disposition: A | Discharge status: Home / Self Care | Payer: Medicare Other | Attending: Physician Assistant | Admitting: Physician Assistant

## 2015-01-02 ENCOUNTER — Encounter (HOSPITAL_COMMUNITY): Payer: Self-pay | Admitting: *Deleted

## 2015-01-02 ENCOUNTER — Emergency Department (HOSPITAL_COMMUNITY): Payer: Medicare Other

## 2015-01-02 DIAGNOSIS — R079 Chest pain, unspecified: Secondary | ICD-10-CM | POA: Diagnosis not present

## 2015-01-02 DIAGNOSIS — Z79899 Other long term (current) drug therapy: Secondary | ICD-10-CM | POA: Insufficient documentation

## 2015-01-02 DIAGNOSIS — Z87891 Personal history of nicotine dependence: Secondary | ICD-10-CM | POA: Insufficient documentation

## 2015-01-02 DIAGNOSIS — E785 Hyperlipidemia, unspecified: Secondary | ICD-10-CM | POA: Insufficient documentation

## 2015-01-02 DIAGNOSIS — E119 Type 2 diabetes mellitus without complications: Secondary | ICD-10-CM | POA: Insufficient documentation

## 2015-01-02 DIAGNOSIS — Z8739 Personal history of other diseases of the musculoskeletal system and connective tissue: Secondary | ICD-10-CM | POA: Insufficient documentation

## 2015-01-02 DIAGNOSIS — I2510 Atherosclerotic heart disease of native coronary artery without angina pectoris: Secondary | ICD-10-CM | POA: Diagnosis not present

## 2015-01-02 DIAGNOSIS — Z9861 Coronary angioplasty status: Secondary | ICD-10-CM | POA: Insufficient documentation

## 2015-01-02 DIAGNOSIS — E780 Pure hypercholesterolemia: Secondary | ICD-10-CM | POA: Insufficient documentation

## 2015-01-02 DIAGNOSIS — Z880 Allergy status to penicillin: Secondary | ICD-10-CM | POA: Insufficient documentation

## 2015-01-02 DIAGNOSIS — R52 Pain, unspecified: Secondary | ICD-10-CM

## 2015-01-02 LAB — COMPREHENSIVE METABOLIC PANEL
ALBUMIN: 3.4 g/dL — AB (ref 3.5–5.0)
ALK PHOS: 57 U/L (ref 38–126)
ALT: 31 U/L (ref 17–63)
AST: 29 U/L (ref 15–41)
Anion gap: 10 (ref 5–15)
BUN: 15 mg/dL (ref 6–20)
CALCIUM: 9.9 mg/dL (ref 8.9–10.3)
CHLORIDE: 103 mmol/L (ref 101–111)
CO2: 30 mmol/L (ref 22–32)
CREATININE: 1.19 mg/dL (ref 0.61–1.24)
GFR calc Af Amer: >60 mL/min (ref >60)
GFR calc non Af Amer: 58 mL/min — ABNORMAL LOW (ref >60)
Glucose, Bld: 95 mg/dL (ref 65–99)
POTASSIUM: 3.9 mmol/L (ref 3.5–5.1)
Sodium: 143 mmol/L (ref 135–145)
TOTAL PROTEIN: 6.9 g/dL (ref 6.5–8.1)
Total Bilirubin: 0.7 mg/dL (ref 0.3–1.2)

## 2015-01-02 LAB — CBC
HCT: 41.8 % (ref 39.0–52.0)
Hemoglobin: 13.6 g/dL (ref 13.0–17.0)
MCH: 30.5 pg (ref 26.0–34.0)
MCHC: 32.5 g/dL (ref 30.0–36.0)
MCV: 93.7 fL (ref 78.0–100.0)
PLATELETS: 244 10*3/uL (ref 150–400)
RBC: 4.46 MIL/uL (ref 4.22–5.81)
RDW: 14 % (ref 11.5–15.5)
WBC: 14.7 10*3/uL — ABNORMAL HIGH (ref 4.0–10.5)

## 2015-01-02 LAB — URINALYSIS, ROUTINE W REFLEX MICROSCOPIC
Bilirubin Urine: NEGATIVE
Glucose, UA: NEGATIVE mg/dL
Hgb urine dipstick: NEGATIVE
KETONES UR: NEGATIVE mg/dL
LEUKOCYTES UA: NEGATIVE
Nitrite: NEGATIVE
Protein, ur: 100 mg/dL — AB
Specific Gravity, Urine: 1.022 (ref 1.005–1.030)
Urobilinogen, UA: 1 mg/dL (ref 0.0–1.0)
pH: 5.5 (ref 5.0–8.0)

## 2015-01-02 LAB — I-STAT TROPONIN, ED
TROPONIN I, POC: 0 ng/mL (ref 0.00–0.08)
Troponin i, poc: 0 ng/mL (ref 0.00–0.08)

## 2015-01-02 LAB — LIPASE, BLOOD: LIPASE: 63 U/L — AB (ref 22–51)

## 2015-01-02 LAB — URINE MICROSCOPIC-ADD ON

## 2015-01-02 MED ORDER — ASPIRIN 325 MG PO TABS
325.0000 mg | ORAL_TABLET | Freq: Every day | ORAL | Status: DC
  Administered 2015-01-02: 325 mg via ORAL
  Filled 2015-01-02: qty 1

## 2015-01-02 NOTE — ED Provider Notes (Signed)
I received this patient in signout from Dr. Thomasene Lot. She presented with abdominal pain moving into the center of his abdomen and then into his chest, worse with movement. We were awaiting serial troponins. Second troponin was negative. On reexamination, the patient is well-appearing. Patient discharged home with instructions to return if he has worsening chest pain, shortness of breath, vomiting, bloody stools, or any other new or alarming symptoms. Patient voiced understanding was discharged in satisfactory condition.  Sharlett Iles, MD 01/02/15 [phone]

## 2015-01-02 NOTE — Discharge Instructions (Signed)
We are unsure what is causing her pain. We have made sure that it is not her heart today. You do have an elevation in your lipase. This could mean a beginning of something called pancreatitis. However you are able to take food and you are not vomiting. If her pain increases or you have vomiting or fever  Abdominal Pain Many things can cause abdominal pain. Usually, abdominal pain is not caused by a disease and will improve without treatment. It can often be observed and treated at home. Your health care provider will do a physical exam and possibly order blood tests and X-rays to help determine the seriousness of your pain. However, in many cases, more time must pass before a clear cause of the pain can be found. Before that point, your health care provider may not know if you need more testing or further treatment. HOME CARE INSTRUCTIONS  Monitor your abdominal pain for any changes. The following actions may help to alleviate any discomfort you are experiencing:  Only take over-the-counter or prescription medicines as directed by your health care provider.  Do not take laxatives unless directed to do so by your health care provider.  Try a clear liquid diet (broth, tea, or water) as directed by your health care provider. Slowly move to a bland diet as tolerated. SEEK MEDICAL CARE IF:  You have unexplained abdominal pain.  You have abdominal pain associated with nausea or diarrhea.  You have pain when you urinate or have a bowel movement.  You experience abdominal pain that wakes you in the night.  You have abdominal pain that is worsened or improved by eating food.  You have abdominal pain that is worsened with eating fatty foods.  You have a fever. SEEK IMMEDIATE MEDICAL CARE IF:   Your pain does not go away within 2 hours.  You keep throwing up (vomiting).  Your pain is felt only in portions of the abdomen, such as the right side or the left lower portion of the abdomen.  You  pass bloody or black tarry stools. MAKE SURE YOU:  Understand these instructions.   Will watch your condition.   Will get help right away if you are not doing well or get worse.  Document Released: 02/24/2005 Document Revised: 05/22/2013 Document Reviewed: 01/24/2013 [facility] Patient Information 2015 [place], [place]. This information is not intended to replace advice given to you by your health care provider. Make sure you discuss any questions you have with your health care provider. please return immediately

## 2015-01-02 NOTE — ED Notes (Signed)
PT placed in gown and in bed. Pt monitored by pulse ox, bp cuff, and 5-lead.

## 2015-01-02 NOTE — ED Notes (Signed)
Patient transported to X-ray 

## 2015-01-02 NOTE — ED Provider Notes (Signed)
CSN: [id number]     Arrival date & time 01/02/15  1256 History   First MD Initiated Contact with Patient 01/02/15 1331     Chief Complaint  Patient presents with  . Chest Pain     (Consider location/radiation/quality/duration/timing/severity/associated sxs/prior Treatment) Patient is a 74 y.o. male presenting with chest pain.  Chest Pain Associated symptoms: no abdominal pain, no cough, no fever, no palpitations and no shortness of breath      Patient is a 74 year old male presenting with chest pain. Patient has history of cholesterol blood pressure and diabetes. I would describe this is atypical chest pain.  It started in his bilateral love handles and then moved to the center of his abdomen, then to his bilateral chest.  It is made worse with moving to the right or to the left. No shortness no diaphoresis no movement towards left or right arm. Patient denies any heavy lifting. It is nonexertional pain.  Past Medical History  Diagnosis Date  . Diabetes mellitus without complication   . Rheumatoid arthritis   . High cholesterol   . Coronary artery disease   . Hyperlipidemia 12/13/2014   Past Surgical History  Procedure Laterality Date  . Coronary stent placement    . Brain surgery     No family history on file. History  Substance Use Topics  . Smoking status: Former Smoker -- 0.15 packs/day    Types: Cigarettes    Quit date: 12/11/2014  . Smokeless tobacco: Not on file  . Alcohol Use: Yes     Comment: occasional    Review of Systems  Constitutional: Negative for fever and activity change.  HENT: Negative for drooling and hearing loss.   Eyes: Negative for discharge and redness.  Respiratory: Negative for cough and shortness of breath.   Cardiovascular: Positive for chest pain. Negative for palpitations and leg swelling.  Gastrointestinal: Negative for abdominal pain.  Genitourinary: Negative for dysuria and urgency.  Musculoskeletal: Negative for arthralgias.   Allergic/Immunologic: Negative for immunocompromised state.  Neurological: Negative for seizures and speech difficulty.  Psychiatric/Behavioral: Negative for behavioral problems and agitation.  All other systems reviewed and are negative.     Allergies  Amoxicillin  Home Medications   Prior to Admission medications   Medication Sig Start Date End Date Taking? Authorizing Provider  budesonide-formoterol (SYMBICORT) 160-4.5 MCG/ACT inhaler Inhale 2 puffs into the lungs 2 (two) times daily. 12/15/14   Eugenie Filler, MD  etanercept (ENBREL SURECLICK) 50 MG/ML injection Inject 50 mg into the skin once a week. On Thursdays    Historical Provider, MD  fenofibrate (TRICOR) 48 MG tablet Take 48 mg by mouth daily.    Historical Provider, MD  folic acid (FOLVITE) 1 MG tablet Take 1 mg by mouth daily.    Historical Provider, MD  guaiFENesin (MUCINEX) 600 MG 12 hr tablet Take 2 tablets (1,200 mg total) by mouth 2 (two) times daily. Take for 4 days then stop. Patient not taking: Reported on 12/20/2014 12/15/14   Eugenie Filler, MD  hydrochlorothiazide (HYDRODIURIL) 25 MG tablet Take 25 mg by mouth daily.    Historical Provider, MD  levalbuterol Penne Lash) 0.63 MG/3ML nebulizer solution Take 3 mLs (0.63 mg total) by nebulization every 6 (six) hours as needed for wheezing or shortness of breath. Use 3 times daily x 4 days, then every 6 hours as needed for SOB. 12/15/14   Eugenie Filler, MD  levofloxacin (LEVAQUIN) 500 MG tablet Take 1 tablet (500 mg total) by mouth  daily. Take for 4 days then stop. Patient not taking: Reported on 12/20/2014 12/15/14   Eugenie Filler, MD  metFORMIN (GLUCOPHAGE) 1000 MG tablet Take 1 tablet (1,000 mg total) by mouth daily with breakfast. 12/20/14   Arnoldo Morale, MD  metoprolol tartrate (LOPRESSOR) 25 MG tablet Take 25 mg by mouth daily.    Historical Provider, MD  predniSONE (DELTASONE) 10 MG tablet Take 4 tablets (40 mg total) by mouth daily with breakfast. Take 4  tablets (40mg ) daily x 2 days, then 2 tablets (20mg ) x 3 days, then 1 tablet (10mg ) x 3 days,  Then back to home dose of 5 mg daily. 12/16/14   Eugenie Filler, MD  predniSONE (DELTASONE) 5 MG tablet Take 1 tablet (5 mg total) by mouth daily with breakfast. Patient not taking: Reported on 12/20/2014 12/23/14   Eugenie Filler, MD  rosuvastatin (CRESTOR) 5 MG tablet Take 5 mg by mouth daily.    Historical Provider, MD  tiotropium (SPIRIVA HANDIHALER) 18 MCG inhalation capsule Place 1 capsule (18 mcg total) into inhaler and inhale daily. 12/15/14   Eugenie Filler, MD   BP 118/70 mmHg  Pulse 82  Temp(Src) 98.6 F (37 C) (Oral)  Resp 20  SpO2 95% Physical Exam  Constitutional: He is oriented to person, place, and time. He appears well-nourished.  HENT:  Head: Normocephalic.  Mouth/Throat: Oropharynx is clear and moist.  Eyes: Conjunctivae are normal.  Neck: No tracheal deviation present.  Cardiovascular: Normal rate.   Pulmonary/Chest: Effort normal. No stridor. No respiratory distress.  Abdominal: Soft. There is no tenderness. There is no guarding.  Musculoskeletal: Normal range of motion. He exhibits no edema.  Neurological: He is oriented to person, place, and time. No cranial nerve deficit.  Skin: Skin is warm and dry. No rash noted. He is not diaphoretic.  Psychiatric: He has a normal mood and affect. His behavior is normal.  Nursing note and vitals reviewed.   ED Course  Procedures (including critical care time) Labs Review Labs Reviewed  URINALYSIS, Bertha (NOT AT [facility]) - Abnormal; Notable for the following:    Protein, ur 100 (*)    All other components within normal limits  URINE MICROSCOPIC-ADD ON  LIPASE, BLOOD  COMPREHENSIVE METABOLIC PANEL  CBC  I-STAT TROPOININ, ED    Imaging Review No results found.   EKG Interpretation None      MDM   Final diagnoses:  None   patient is a 74 year old male with hypertension hyperlipidemia.  Recent hospitalization a couple weeks ago for pneumonia. He presents today with pain on the right and left side of his chest. It started in his bilateral love handles and then moved to the center of his abdomen, then to his bilateral chest. It is made worse by moving to the right or to the left any kind of twisting motion. Patient denies any kind of new exercise. Some very atypical for chest pain. But given his risk factors we will do a delta troponin.  We'll get x-ray to ensure there is no occult pneumonia. Patient has no tachypnea and tachycardia or shortness of breath. There fore for not as concerned formal pulmonary embolism at this time.  First troponin negative. We'll wait for three-hour troponin.  Will sign out to next team.  3:37 PM Patient has tiny increased above normal and lipase. Patient however has had no vomiting. And no abdominal pain on exam. White count is 14. These are fairly  nonspecific findings. We  discussed with patient and he feels comfortable going home and will follow-up with his primary care provider.  Kayda Allers Julio Alm, MD 01/02/15 1538

## 2015-01-02 NOTE — ED Notes (Signed)
Pt states abdominal pain that radiates to sternum and around both sides since this am.  States pain increases with movement.  Denies sob or nausea.

## 2015-01-02 NOTE — ED Notes (Signed)
Patient transported back from X-ray 

## 2015-01-03 ENCOUNTER — Encounter (HOSPITAL_COMMUNITY): Payer: Self-pay | Admitting: Emergency Medicine

## 2015-01-03 ENCOUNTER — Emergency Department (HOSPITAL_COMMUNITY): Payer: Medicare Other

## 2015-01-03 ENCOUNTER — Emergency Department (HOSPITAL_COMMUNITY)
Admission: EM | Admit: 2015-01-03 | Discharge: 2015-01-03 | Disposition: A | Discharge status: Home / Self Care | Payer: Medicare Other | Attending: Physician Assistant | Admitting: Physician Assistant

## 2015-01-03 DIAGNOSIS — Z87891 Personal history of nicotine dependence: Secondary | ICD-10-CM | POA: Insufficient documentation

## 2015-01-03 DIAGNOSIS — R0789 Other chest pain: Secondary | ICD-10-CM | POA: Insufficient documentation

## 2015-01-03 DIAGNOSIS — Z9861 Coronary angioplasty status: Secondary | ICD-10-CM | POA: Diagnosis not present

## 2015-01-03 DIAGNOSIS — E119 Type 2 diabetes mellitus without complications: Secondary | ICD-10-CM | POA: Diagnosis not present

## 2015-01-03 DIAGNOSIS — M069 Rheumatoid arthritis, unspecified: Secondary | ICD-10-CM | POA: Diagnosis not present

## 2015-01-03 DIAGNOSIS — Z7952 Long term (current) use of systemic steroids: Secondary | ICD-10-CM | POA: Diagnosis not present

## 2015-01-03 DIAGNOSIS — Z79899 Other long term (current) drug therapy: Secondary | ICD-10-CM | POA: Diagnosis not present

## 2015-01-03 DIAGNOSIS — R109 Unspecified abdominal pain: Secondary | ICD-10-CM | POA: Diagnosis not present

## 2015-01-03 DIAGNOSIS — E780 Pure hypercholesterolemia: Secondary | ICD-10-CM | POA: Insufficient documentation

## 2015-01-03 DIAGNOSIS — Z7951 Long term (current) use of inhaled steroids: Secondary | ICD-10-CM | POA: Insufficient documentation

## 2015-01-03 DIAGNOSIS — Z880 Allergy status to penicillin: Secondary | ICD-10-CM | POA: Insufficient documentation

## 2015-01-03 DIAGNOSIS — I2510 Atherosclerotic heart disease of native coronary artery without angina pectoris: Secondary | ICD-10-CM | POA: Insufficient documentation

## 2015-01-03 DIAGNOSIS — R079 Chest pain, unspecified: Secondary | ICD-10-CM | POA: Diagnosis present

## 2015-01-03 HISTORY — DX: Essential (primary) hypertension: I10

## 2015-01-03 HISTORY — DX: Malignant (primary) neoplasm, unspecified: C80.1

## 2015-01-03 LAB — CBC WITH DIFFERENTIAL/PLATELET
BASOS PCT: 0 % (ref 0–1)
Basophils Absolute: 0 10*3/uL (ref 0.0–0.1)
EOS ABS: 0.3 10*3/uL (ref 0.0–0.7)
Eosinophils Relative: 3 % (ref 0–5)
HCT: 44.5 % (ref 39.0–52.0)
HEMOGLOBIN: 14.3 g/dL (ref 13.0–17.0)
LYMPHS ABS: 2.2 10*3/uL (ref 0.7–4.0)
LYMPHS PCT: 19 % (ref 12–46)
MCH: 30 pg (ref 26.0–34.0)
MCHC: 32.1 g/dL (ref 30.0–36.0)
MCV: 93.3 fL (ref 78.0–100.0)
Monocytes Absolute: 1.2 10*3/uL — ABNORMAL HIGH (ref 0.1–1.0)
Monocytes Relative: 10 % (ref 3–12)
Neutro Abs: 7.8 10*3/uL — ABNORMAL HIGH (ref 1.7–7.7)
Neutrophils Relative %: 68 % (ref 43–77)
Platelets: 241 10*3/uL (ref 150–400)
RBC: 4.77 MIL/uL (ref 4.22–5.81)
RDW: 14.1 % (ref 11.5–15.5)
WBC: 11.5 10*3/uL — AB (ref 4.0–10.5)

## 2015-01-03 LAB — I-STAT TROPONIN, ED
Troponin i, poc: 0 ng/mL (ref 0.00–0.08)
Troponin i, poc: 0 ng/mL (ref 0.00–0.08)

## 2015-01-03 LAB — PROTIME-INR
INR: 1.18 (ref 0.00–1.49)
Prothrombin Time: 15.2 seconds (ref 11.6–15.2)

## 2015-01-03 LAB — COMPREHENSIVE METABOLIC PANEL
ALK PHOS: 54 U/L (ref 38–126)
ALT: 31 U/L (ref 17–63)
ANION GAP: 12 (ref 5–15)
AST: 31 U/L (ref 15–41)
Albumin: 3.4 g/dL — ABNORMAL LOW (ref 3.5–5.0)
BUN: 14 mg/dL (ref 6–20)
CO2: 28 mmol/L (ref 22–32)
Calcium: 9.5 mg/dL (ref 8.9–10.3)
Chloride: 100 mmol/L — ABNORMAL LOW (ref 101–111)
Creatinine, Ser: 1.24 mg/dL (ref 0.61–1.24)
GFR calc Af Amer: >60 mL/min (ref >60)
GFR calc non Af Amer: 56 mL/min — ABNORMAL LOW (ref >60)
GLUCOSE: 131 mg/dL — AB (ref 65–99)
Potassium: 3.6 mmol/L (ref 3.5–5.1)
SODIUM: 140 mmol/L (ref 135–145)
TOTAL PROTEIN: 6.7 g/dL (ref 6.5–8.1)
Total Bilirubin: 0.5 mg/dL (ref 0.3–1.2)

## 2015-01-03 LAB — D-DIMER, QUANTITATIVE (NOT AT ARMC): D-Dimer, Quant: 6.52 ug/mL-FEU — ABNORMAL HIGH (ref 0.00–0.48)

## 2015-01-03 LAB — LIPASE, BLOOD: LIPASE: 63 U/L — AB (ref 22–51)

## 2015-01-03 LAB — AMMONIA: AMMONIA: 11 umol/L (ref 9–35)

## 2015-01-03 MED ORDER — IBUPROFEN 800 MG PO TABS
800.0000 mg | ORAL_TABLET | Freq: Once | ORAL | Status: AC
  Administered 2015-01-03: 800 mg via ORAL
  Filled 2015-01-03: qty 1

## 2015-01-03 MED ORDER — IOHEXOL 350 MG/ML SOLN
100.0000 mL | Freq: Once | INTRAVENOUS | Status: AC | PRN
Start: 2015-01-03 — End: 2015-01-03
  Administered 2015-01-03: 100 mL via INTRAVENOUS

## 2015-01-03 MED ORDER — IOHEXOL 300 MG/ML  SOLN: 25.0000 mL | INTRAMUSCULAR | Status: DC

## 2015-01-03 MED ORDER — IOHEXOL 300 MG/ML  SOLN
25.0000 mL | INTRAMUSCULAR | Status: AC
  Administered 2015-01-03: 25 mL via ORAL

## 2015-01-03 NOTE — ED Provider Notes (Signed)
CSN: [id number]     Arrival date & time 01/03/15  0944 History   First MD Initiated Contact with Patient 01/03/15 [phone]     Chief Complaint  Patient presents with  . Chest Pain     (Consider location/radiation/quality/duration/timing/severity/associated sxs/prior Treatment) HPI   Patient is a 74 year old male who I saw yesterday. He is here with the same complaint. Patient states that at one point he had abdominal pain now he has chest pain. It hurts most when he is moving. He reported he couldn't sleep because of the pain last night. It is nonexertional.     Past Medical History  Diagnosis Date  . Diabetes mellitus without complication   . Rheumatoid arthritis   . High cholesterol   . Coronary artery disease   . Hyperlipidemia 12/13/2014   Past Surgical History  Procedure Laterality Date  . Coronary stent placement    . Brain surgery     History reviewed. No pertinent family history. History  Substance Use Topics  . Smoking status: Former Smoker -- 0.15 packs/day    Types: Cigarettes    Quit date: 12/11/2014  . Smokeless tobacco: Not on file  . Alcohol Use: Yes     Comment: occasional    Review of Systems  Constitutional: Positive for appetite change. Negative for fever and activity change.  HENT: Negative for drooling and hearing loss.   Eyes: Negative for discharge and redness.  Respiratory: Negative for cough and shortness of breath.   Cardiovascular: Positive for chest pain.  Gastrointestinal: Positive for abdominal pain. Negative for nausea and vomiting.  Genitourinary: Negative for dysuria and urgency.  Musculoskeletal: Negative for arthralgias.  Neurological: Negative for seizures and speech difficulty.  Psychiatric/Behavioral: Negative for behavioral problems and agitation.  All other systems reviewed and are negative.     Allergies  Amoxicillin  Home Medications   Prior to Admission medications   Medication Sig Start Date End Date Taking?  Authorizing Provider  albuterol (PROVENTIL HFA;VENTOLIN HFA) 108 (90 BASE) MCG/ACT inhaler Inhale 1 puff into the lungs every 6 (six) hours as needed for wheezing or shortness of breath.   Yes Historical Provider, MD  budesonide-formoterol (SYMBICORT) 160-4.5 MCG/ACT inhaler Inhale 2 puffs into the lungs 2 (two) times daily. 12/15/14  Yes Eugenie Filler, MD  etanercept (ENBREL SURECLICK) 50 MG/ML injection Inject 50 mg into the skin once a week. On Sundays   Yes Historical Provider, MD  fenofibrate (TRICOR) 48 MG tablet Take 48 mg by mouth daily.   Yes Historical Provider, MD  folic acid (FOLVITE) 1 MG tablet Take 1 mg by mouth daily.   Yes Historical Provider, MD  hydrochlorothiazide (HYDRODIURIL) 25 MG tablet Take 25 mg by mouth daily.   Yes Historical Provider, MD  levalbuterol (XOPENEX) 0.63 MG/3ML nebulizer solution Take 3 mLs (0.63 mg total) by nebulization every 6 (six) hours as needed for wheezing or shortness of breath. Use 3 times daily x 4 days, then every 6 hours as needed for SOB. 12/15/14  Yes Eugenie Filler, MD  metFORMIN (GLUCOPHAGE) 1000 MG tablet Take 1 tablet (1,000 mg total) by mouth daily with breakfast. 12/20/14  Yes Arnoldo Morale, MD  metoprolol tartrate (LOPRESSOR) 25 MG tablet Take 25 mg by mouth daily.   Yes Historical Provider, MD  Multiple Vitamin (MULTIVITAMIN) tablet Take 1 tablet by mouth daily.   Yes Historical Provider, MD  pantoprazole (PROTONIX) 40 MG tablet Take 40 mg by mouth 2 (two) times daily.   Yes Historical Provider,  MD  predniSONE (DELTASONE) 5 MG tablet Take 1 tablet (5 mg total) by mouth daily with breakfast. 12/23/14  Yes Eugenie Filler, MD  rosuvastatin (CRESTOR) 5 MG tablet Take 5 mg by mouth daily.   Yes Historical Provider, MD  tiotropium (SPIRIVA HANDIHALER) 18 MCG inhalation capsule Place 1 capsule (18 mcg total) into inhaler and inhale daily. 12/15/14  Yes Eugenie Filler, MD  guaiFENesin (MUCINEX) 600 MG 12 hr tablet Take 2 tablets (1,200 mg  total) by mouth 2 (two) times daily. Take for 4 days then stop. Patient not taking: Reported on 12/20/2014 12/15/14   Eugenie Filler, MD  predniSONE (DELTASONE) 10 MG tablet Take 4 tablets (40 mg total) by mouth daily with breakfast. Take 4 tablets (40mg ) daily x 2 days, then 2 tablets (20mg ) x 3 days, then 1 tablet (10mg ) x 3 days,  Then back to home dose of 5 mg daily. Patient not taking: Reported on 01/02/2015 12/16/14   Eugenie Filler, MD   BP 111/75 mmHg  Pulse 78  Resp 20  Ht 5\' 9"  (1.753 m)  Wt 236 lb (107.049 kg)  BMI 34.84 kg/m2  SpO2 98% Physical Exam  Constitutional: He is oriented to person, place, and time. He appears well-nourished.  HENT:  Head: Normocephalic.  Mouth/Throat: Oropharynx is clear and moist.  Eyes: Conjunctivae are normal.  Neck: No tracheal deviation present.  Cardiovascular: Normal rate.   Pulmonary/Chest: Effort normal. No stridor. No respiratory distress.  Abdominal: Soft. There is no tenderness. There is no guarding.  Musculoskeletal: Normal range of motion. He exhibits no edema.  Neurological: He is oriented to person, place, and time. No cranial nerve deficit.  Skin: Skin is warm and dry. No rash noted. He is not diaphoretic.  Psychiatric: He has a normal mood and affect. His behavior is normal.  Nursing note and vitals reviewed.   ED Course  Procedures (including critical care time) Labs Review Labs Reviewed  CBC WITH DIFFERENTIAL/PLATELET - Abnormal; Notable for the following:    WBC 11.5 (*)    Neutro Abs 7.8 (*)    Monocytes Absolute 1.2 (*)    All other components within normal limits  COMPREHENSIVE METABOLIC PANEL - Abnormal; Notable for the following:    Chloride 100 (*)    Glucose, Bld 131 (*)    Albumin 3.4 (*)    GFR calc non Af Amer 56 (*)    All other components within normal limits  PROTIME-INR  AMMONIA  D-DIMER, QUANTITATIVE (NOT AT [facility])  LIPASE, BLOOD  I-STAT TROPOININ, ED    Imaging Review   EKG  Interpretation   Date/Time:  Friday January 03 2015 10:00:52 EDT Ventricular Rate:  77 PR Interval:  143 QRS Duration: 83 QT Interval:  347 QTC Calculation: 393 R Axis:   114 Text Interpretation:  Sinus rhythm Atrial premature complexes Low voltage  with right axis deviation no acute ischemia No significant change since  last tracing Confirmed by Gerald Leitz ([id number]) on 01/03/2015 10:20:41  AM      MDM   Final diagnoses:  None    Patient is a 74 year old gentleman presenting today with chest pain. Symptoms started yesterday. He was very seen in the emergency department and discharged home. Patient is very bad historian about his symptoms. He states it started on his sides in the center of his abdomen up into the center of chest and then out to bilateral shoulders. No nausea no vomiting no fevers. Feeling that makes it worse  is moving to the right or left.  We will repeat labs. Consider CT.  Elevated d-dimer we had a CT PE. Additionally to rule out a dissection we got a CT abdomen and pelvis.  All of his imaging has have been normal.  Reviewed the images myself and agree with radiologist's impression.   Patient still has this mild discomfort when moving his right and left arm or twisting. I talked to patient's daughter. I am unsure what is causing the pain. However with 2 days of normal labs, and all normal images, normal vital signs, normal physical exam, I see no reason for admission at this time.  Giovanni Bath Julio Alm, MD 01/03/15 1529

## 2015-01-03 NOTE — ED Notes (Signed)
Patient returned from CT

## 2015-01-03 NOTE — ED Notes (Signed)
Chest and abdominal pain for two days; increases with movement. Seen yesterday for the same pain. No relief last night.

## 2015-01-03 NOTE — ED Notes (Signed)
Patient transported to CT 

## 2015-01-03 NOTE — Discharge Instructions (Signed)
We are unsure what is causing your pain. We will need to return if you have any change in symptoms. Please follow-up with uou Chest Pain (Nonspecific) It is often hard to give a specific diagnosis for the cause of chest pain. There is always a chance that your pain could be related to something serious, such as a heart attack or a blood clot in the lungs. You need to follow up with your health care provider for further evaluation. CAUSES   Heartburn.  Pneumonia or bronchitis.  Anxiety or stress.  Inflammation around your heart (pericarditis) or lung (pleuritis or pleurisy).  A blood clot in the lung.  A collapsed lung (pneumothorax). It can develop suddenly on its own (spontaneous pneumothorax) or from trauma to the chest.  Shingles infection (herpes zoster virus). The chest wall is composed of bones, muscles, and cartilage. Any of these can be the source of the pain.  The bones can be bruised by injury.  The muscles or cartilage can be strained by coughing or overwork.  The cartilage can be affected by inflammation and become sore (costochondritis). DIAGNOSIS  Lab tests or other studies may be needed to find the cause of your pain. Your health care provider may have you take a test called an ambulatory electrocardiogram (ECG). An ECG records your heartbeat patterns over a 24-hour period. You may also have other tests, such as:  Transthoracic echocardiogram (TTE). During echocardiography, sound waves are used to evaluate how blood flows through your heart.  Transesophageal echocardiogram (TEE).  Cardiac monitoring. This allows your health care provider to monitor your heart rate and rhythm in real time.  Holter monitor. This is a portable device that records your heartbeat and can help diagnose heart arrhythmias. It allows your health care provider to track your heart activity for several days, if needed.  Stress tests by exercise or by giving medicine that makes the heart beat  faster. TREATMENT   Treatment depends on what may be causing your chest pain. Treatment may include:  Acid blockers for heartburn.  Anti-inflammatory medicine.  Pain medicine for inflammatory conditions.  Antibiotics if an infection is present.  You may be advised to change lifestyle habits. This includes stopping smoking and avoiding alcohol, caffeine, and chocolate.  You may be advised to keep your head raised (elevated) when sleeping. This reduces the chance of acid going backward from your stomach into your esophagus. Most of the time, nonspecific chest pain will improve within 2-3 days with rest and mild pain medicine.  HOME CARE INSTRUCTIONS   If antibiotics were prescribed, take them as directed. Finish them even if you start to feel better.  For the next few days, avoid physical activities that bring on chest pain. Continue physical activities as directed.  Do not use any tobacco products, including cigarettes, chewing tobacco, or electronic cigarettes.  Avoid drinking alcohol.  Only take medicine as directed by your health care provider.  Follow your health care provider's suggestions for further testing if your chest pain does not go away.  Keep any follow-up appointments you made. If you do not go to an appointment, you could develop lasting (chronic) problems with pain. If there is any problem keeping an appointment, call to reschedule. SEEK MEDICAL CARE IF:   Your chest pain does not go away, even after treatment.  You have a rash with blisters on your chest.  You have a fever. SEEK IMMEDIATE MEDICAL CARE IF:   You have increased chest pain or pain that  spreads to your arm, neck, jaw, back, or abdomen.  You have shortness of breath.  You have an increasing cough, or you cough up blood.  You have severe back or abdominal pain.  You feel nauseous or vomit.  You have severe weakness.  You faint.  You have chills. This is an emergency. Do not wait to  see if the pain will go away. Get medical help at once. Call your local emergency services (911 in [place] Do not drive yourself to the hospital. MAKE SURE YOU:   Understand these instructions.  Will watch your condition.  Will get help right away if you are not doing well or get worse. Document Released: 02/24/2005 Document Revised: 05/22/2013 Document Reviewed: 12/21/2007 [facility] Patient Information 2015 [place], [place]. This information is not intended to replace advice given to you by your health care provider. Make sure you discuss any questions you have with your health care provider. r regular physician this week.

## 2015-01-03 NOTE — ED Notes (Signed)
EKG delay due to technology issues. Switching cords and leads in attempt to correct.

## 2015-01-06 ENCOUNTER — Other Ambulatory Visit: Payer: Self-pay | Admitting: Rheumatology

## 2015-01-06 ENCOUNTER — Ambulatory Visit
Admission: RE | Admit: 2015-01-06 | Discharge: 2015-01-06 | Disposition: A | Discharge status: Home / Self Care | Payer: Medicare Other | Source: Ambulatory Visit | Attending: Rheumatology | Admitting: Rheumatology

## 2015-01-06 DIAGNOSIS — R109 Unspecified abdominal pain: Secondary | ICD-10-CM

## 2015-01-10 ENCOUNTER — Ambulatory Visit: Payer: Medicare Other | Attending: Internal Medicine | Admitting: Internal Medicine

## 2015-01-10 ENCOUNTER — Encounter: Payer: Self-pay | Admitting: Internal Medicine

## 2015-01-10 VITALS — BP 129/85 | HR 91 | Temp 99.2°F | Resp 16 | Ht 69.0 in | Wt 231.2 lb

## 2015-01-10 DIAGNOSIS — Z7952 Long term (current) use of systemic steroids: Secondary | ICD-10-CM | POA: Diagnosis not present

## 2015-01-10 DIAGNOSIS — E780 Pure hypercholesterolemia: Secondary | ICD-10-CM | POA: Diagnosis not present

## 2015-01-10 DIAGNOSIS — F17210 Nicotine dependence, cigarettes, uncomplicated: Secondary | ICD-10-CM | POA: Insufficient documentation

## 2015-01-10 DIAGNOSIS — M069 Rheumatoid arthritis, unspecified: Secondary | ICD-10-CM | POA: Diagnosis not present

## 2015-01-10 DIAGNOSIS — E119 Type 2 diabetes mellitus without complications: Secondary | ICD-10-CM | POA: Diagnosis not present

## 2015-01-10 DIAGNOSIS — Z79899 Other long term (current) drug therapy: Secondary | ICD-10-CM | POA: Insufficient documentation

## 2015-01-10 DIAGNOSIS — I10 Essential (primary) hypertension: Secondary | ICD-10-CM | POA: Diagnosis not present

## 2015-01-10 LAB — GLUCOSE, POCT (MANUAL RESULT ENTRY): POC Glucose: 163 mg/dl — AB (ref 70–99)

## 2015-01-10 MED ORDER — GLUCOSE BLOOD VI STRP: ORAL_STRIP | Status: AC

## 2015-01-10 MED ORDER — PREDNISONE 5 MG PO TABS: 5.0000 mg | ORAL_TABLET | Freq: Every day | ORAL | Status: DC

## 2015-01-10 MED ORDER — METOPROLOL TARTRATE 25 MG PO TABS: 25.0000 mg | ORAL_TABLET | Freq: Every day | ORAL | Status: DC

## 2015-01-10 MED ORDER — HYDROCHLOROTHIAZIDE 25 MG PO TABS: 25.0000 mg | ORAL_TABLET | Freq: Every day | ORAL | Status: DC

## 2015-01-10 NOTE — Progress Notes (Signed)
Patient here to establish care and for DM. Patient CBG is 163. Last A1C on 12/20/14 was 6.8. Patient reports pain today located from waist down to knees rated at a 10 described as aching. Pain started yesterday morning. Pain is constant. Patient has not found anything that helps with the pain. Patient reports pain in both of his knees in the morning when he wakes. Patient smokes .5 packs of cigarettes daily. Patient has taken his medications for today and needs refills on hydrochlorothiazide, metoprolol, and prednisone 5mg .

## 2015-01-10 NOTE — Progress Notes (Signed)
Patient ID: Jorge Malone, male   DOB: [birth year], 74 y.o.   MRN: [id number]  CC: establish care  HPI: Jorge Malone is a 74 y.o. male with a history of Rheumatoid Arthritis, Hyperlipidemia, Hypertension, Type 2 DM, CAD s/p stent who recently relocated from [place] and was admitted at [facility] 7/13-7/17 for acute respiratory failure with hypoxia secondary to COPD exacerbation. Patient reports that he has been doing significantly better since hospital discharge.  He seen Rheumatologist last week and was told to stop Enbrel and begin colchicine to see if his swelling and pain was due to Gout. He reports that that his hands have been swollen constantly for years even while on Enbrel and prednisone. He states that he has moderate pain in his hands. He reports swelling and moderate to severe pain in his bilateral knees  He has been using Aleve with relief. He denies fevers or chills.   Patient has No headache, No chest pain, No abdominal pain - No Nausea, No new weakness tingling or numbness, No Cough - SOB.  Allergies  Allergen Reactions  . Amoxicillin     Pt tolerates cephalosporins   Past Medical History  Diagnosis Date  . Diabetes mellitus without complication   . Rheumatoid arthritis   . High cholesterol   . Coronary artery disease   . Hyperlipidemia 12/13/2014  . Cancer   . Hypertension    Current Outpatient Prescriptions on File Prior to Visit  Medication Sig Dispense Refill  . albuterol (PROVENTIL HFA;VENTOLIN HFA) 108 (90 BASE) MCG/ACT inhaler Inhale 1 puff into the lungs every 6 (six) hours as needed for wheezing or shortness of breath.    . etanercept (ENBREL SURECLICK) 50 MG/ML injection Inject 50 mg into the skin once a week. On Sundays    . fenofibrate (TRICOR) 48 MG tablet Take 48 mg by mouth daily.    . folic acid (FOLVITE) 1 MG tablet Take 1 mg by mouth daily.    Marland Kitchen guaiFENesin (MUCINEX) 600 MG 12 hr tablet Take 2 tablets (1,200 mg total) by mouth 2 (two)  times daily. Take for 4 days then stop. 16 tablet 0  . hydrochlorothiazide (HYDRODIURIL) 25 MG tablet Take 25 mg by mouth daily.    Marland Kitchen levalbuterol (XOPENEX) 0.63 MG/3ML nebulizer solution Take 3 mLs (0.63 mg total) by nebulization every 6 (six) hours as needed for wheezing or shortness of breath. Use 3 times daily x 4 days, then every 6 hours as needed for SOB. 360 mL 3  . metFORMIN (GLUCOPHAGE) 1000 MG tablet Take 1 tablet (1,000 mg total) by mouth daily with breakfast. 60 tablet 2  . metoprolol tartrate (LOPRESSOR) 25 MG tablet Take 25 mg by mouth daily.    . Multiple Vitamin (MULTIVITAMIN) tablet Take 1 tablet by mouth daily.    . pantoprazole (PROTONIX) 40 MG tablet Take 40 mg by mouth 2 (two) times daily.    . predniSONE (DELTASONE) 5 MG tablet Take 1 tablet (5 mg total) by mouth daily with breakfast.    . rosuvastatin (CRESTOR) 5 MG tablet Take 5 mg by mouth daily.    Marland Kitchen tiotropium (SPIRIVA HANDIHALER) 18 MCG inhalation capsule Place 1 capsule (18 mcg total) into inhaler and inhale daily. 30 capsule 2  . budesonide-formoterol (SYMBICORT) 160-4.5 MCG/ACT inhaler Inhale 2 puffs into the lungs 2 (two) times daily. (Patient not taking: Reported on 01/10/2015) 1 Inhaler 0  . predniSONE (DELTASONE) 10 MG tablet Take 4 tablets (40 mg total) by mouth daily  with breakfast. Take 4 tablets (40mg ) daily x 2 days, then 2 tablets (20mg ) x 3 days, then 1 tablet (10mg ) x 3 days,  Then back to home dose of 5 mg daily. (Patient not taking: Reported on 01/02/2015) 17 tablet 0   No current facility-administered medications on file prior to visit.   History reviewed. No pertinent family history. Social History   Social History  . Marital Status: Single    Spouse Name: N/A  . Number of Children: N/A  . Years of Education: N/A   Occupational History  . Not on file.   Social History Main Topics  . Smoking status: Current Every Day Smoker -- 0.50 packs/day    Types: Cigarettes    Last Attempt to Quit:  12/11/2014  . Smokeless tobacco: Not on file  . Alcohol Use: Yes     Comment: occasional  . Drug Use: No  . Sexual Activity: Not on file   Other Topics Concern  . Not on file   Social History Narrative    Review of Systems: Other than what is stated in HPI, all other systems are negative.   Objective:   Filed Vitals:   01/10/15 1624  BP: 129/85  Pulse: 91  Temp: 99.2 F (37.3 C)  Resp: 16    Physical Exam  Constitutional: He is oriented to person, place, and time.  Cardiovascular: Normal rate, regular rhythm and normal heart sounds.   Pulmonary/Chest: Effort normal and breath sounds normal.  Musculoskeletal: He exhibits edema. He exhibits no tenderness.  Bilateral hand swelling and knees  Neurological: He is alert and oriented to person, place, and time.  Skin: Skin is warm. No erythema.     Lab Results  Component Value Date   WBC 11.5* 01/03/2015   HGB 14.3 01/03/2015   HCT 44.5 01/03/2015   MCV 93.3 01/03/2015   PLT 241 01/03/2015   Lab Results  Component Value Date   CREATININE 1.24 01/03/2015   BUN 14 01/03/2015   NA 140 01/03/2015   K 3.6 01/03/2015   CL 100* 01/03/2015   CO2 28 01/03/2015    Lab Results  Component Value Date   HGBA1C 6.80 12/20/2014   Lipid Panel  No results found for: CHOL, TRIG, HDL, CHOLHDL, VLDL, LDLCALC     Assessment and plan:   Jorge Malone was seen today for establish care.  Diagnoses and all orders for this visit:  Rheumatoid arthritis -    Refilled predniSONE (DELTASONE) 5 MG tablet; Take 1 tablet (5 mg total) by mouth daily with breakfast. Stable, continue with Dr. Charlestine Night  Type 2 diabetes mellitus without complication -     Glucose (CBG) -     Microalbumin, urine -     glucose blood (TRUETEST TEST) test strip; Check sugars twice per day for E11.9 Stable, continue regimen  Essential hypertension -     hydrochlorothiazide (HYDRODIURIL) 25 MG tablet; Take 1 tablet (25 mg total) by mouth daily. -     metoprolol  tartrate (LOPRESSOR) 25 MG tablet; Take 1 tablet (25 mg total) by mouth daily. Stable, refilled   Return in about 3 months (around 04/12/2015) for DM/HTN.       Lance Bosch, [facility] [phone] 01/10/2015, 4:26 PM

## 2015-01-11 LAB — MICROALBUMIN, URINE: Microalb, Ur: 63.8 mg/dL — ABNORMAL HIGH (ref <2.0)

## 2015-01-30 ENCOUNTER — Encounter: Payer: Self-pay | Admitting: Cardiology

## 2015-01-31 ENCOUNTER — Other Ambulatory Visit: Payer: Self-pay

## 2015-01-31 ENCOUNTER — Telehealth: Payer: Self-pay

## 2015-01-31 DIAGNOSIS — I10 Essential (primary) hypertension: Secondary | ICD-10-CM

## 2015-01-31 MED ORDER — LISINOPRIL 2.5 MG PO TABS: 2.5000 mg | ORAL_TABLET | Freq: Every day | ORAL | Status: DC

## 2015-01-31 NOTE — Telephone Encounter (Signed)
Nurse called patient, reached voicemail on home number, left message for patient to call Nira Conn with [facility], at [phone]. Nurse called patients mobile number, reached recording explaining number is not valid.

## 2015-01-31 NOTE — Telephone Encounter (Signed)
-----   Message from Lance Bosch, NP sent at 01/19/2015  8:04 PM EDT ----- Patient has high level of protein in urine. Please inform patient that I would like to put him on lisinopril 2.5 mg daily to help protect his kidneys and lower this level. Please inform that this medication may cause a dry cough. Please send order. Have him come back 2 weeks after beginning medication to have his BP checked.

## 2015-01-31 NOTE — Telephone Encounter (Signed)
Patient called returning nurses call.

## 2015-01-31 NOTE — Telephone Encounter (Signed)
Nurse called patient, patient verified date of birth. Patient requested nurse to speak to patients daughter. Daughter is aware of patient having high level of potassium in his urine. Daughter agrees to pick up lisinopril 2.5mg  at [facility] on [address]. Daughter aware this medication will help protect his kidneys and lower protein level. Daughter aware of medication possibly causing dry cough and will call [facility] if this occurs.  Medication order sent. Nurse transferred daughter to front office staff to schedule BP check in 2 weeks.  Daughter voices understanding and has no further questions at this time.

## 2015-02-03 ENCOUNTER — Emergency Department (HOSPITAL_COMMUNITY)
Admission: EM | Admit: 2015-02-03 | Discharge: 2015-02-03 | Disposition: A | Discharge status: Home / Self Care | Payer: Medicare Other | Attending: Emergency Medicine | Admitting: Emergency Medicine

## 2015-02-03 ENCOUNTER — Encounter (HOSPITAL_COMMUNITY): Payer: Self-pay | Admitting: Vascular Surgery

## 2015-02-03 DIAGNOSIS — E782 Mixed hyperlipidemia: Secondary | ICD-10-CM | POA: Diagnosis not present

## 2015-02-03 DIAGNOSIS — M25521 Pain in right elbow: Secondary | ICD-10-CM | POA: Diagnosis not present

## 2015-02-03 DIAGNOSIS — M79641 Pain in right hand: Secondary | ICD-10-CM | POA: Insufficient documentation

## 2015-02-03 DIAGNOSIS — Z79899 Other long term (current) drug therapy: Secondary | ICD-10-CM | POA: Insufficient documentation

## 2015-02-03 DIAGNOSIS — M1990 Unspecified osteoarthritis, unspecified site: Secondary | ICD-10-CM

## 2015-02-03 DIAGNOSIS — M25562 Pain in left knee: Secondary | ICD-10-CM | POA: Diagnosis not present

## 2015-02-03 DIAGNOSIS — Z720 Tobacco use: Secondary | ICD-10-CM | POA: Insufficient documentation

## 2015-02-03 DIAGNOSIS — M25522 Pain in left elbow: Secondary | ICD-10-CM | POA: Insufficient documentation

## 2015-02-03 DIAGNOSIS — I10 Essential (primary) hypertension: Secondary | ICD-10-CM | POA: Insufficient documentation

## 2015-02-03 DIAGNOSIS — M79642 Pain in left hand: Secondary | ICD-10-CM | POA: Diagnosis not present

## 2015-02-03 DIAGNOSIS — E119 Type 2 diabetes mellitus without complications: Secondary | ICD-10-CM | POA: Diagnosis not present

## 2015-02-03 DIAGNOSIS — M25561 Pain in right knee: Secondary | ICD-10-CM | POA: Insufficient documentation

## 2015-02-03 DIAGNOSIS — I2510 Atherosclerotic heart disease of native coronary artery without angina pectoris: Secondary | ICD-10-CM | POA: Insufficient documentation

## 2015-02-03 DIAGNOSIS — Z880 Allergy status to penicillin: Secondary | ICD-10-CM | POA: Insufficient documentation

## 2015-02-03 MED ORDER — ONDANSETRON 4 MG PO TBDP
4.0000 mg | ORAL_TABLET | Freq: Once | ORAL | Status: AC
  Administered 2015-02-03: 4 mg via ORAL
  Filled 2015-02-03: qty 1

## 2015-02-03 MED ORDER — HYDROCODONE-ACETAMINOPHEN 5-325 MG PO TABS: 2.0000 | ORAL_TABLET | ORAL | Status: DC | PRN

## 2015-02-03 MED ORDER — OXYCODONE-ACETAMINOPHEN 5-325 MG PO TABS
2.0000 | ORAL_TABLET | Freq: Once | ORAL | Status: AC
  Administered 2015-02-03: 2 via ORAL
  Filled 2015-02-03: qty 2

## 2015-02-03 MED ORDER — PREDNISONE 20 MG PO TABS
60.0000 mg | ORAL_TABLET | Freq: Once | ORAL | Status: AC
  Administered 2015-02-03: 60 mg via ORAL
  Filled 2015-02-03: qty 3

## 2015-02-03 MED ORDER — PREDNISONE 20 MG PO TABS: 20.0000 mg | ORAL_TABLET | Freq: Every day | ORAL | Status: DC

## 2015-02-03 NOTE — ED Notes (Signed)
James, MD at bedside. 

## 2015-02-03 NOTE — ED Notes (Signed)
Patient brought back to room via wheelchair; patient undressed, in gown, on monitor, continuous pulse oximetry and blood pressure cuff 

## 2015-02-03 NOTE — Discharge Instructions (Signed)
Arthritis, Nonspecific °Arthritis is pain, redness, warmth, or puffiness (inflammation) of a joint. The joint may be stiff or hurt when you move it. One or more joints may be affected. There are many types of arthritis. Your doctor may not know what type you have right away. The most common cause of arthritis is wear and tear on the joint (osteoarthritis). °HOME CARE  °· Only take medicine as told by your doctor. °· Rest the joint as much as possible. °· Raise (elevate) your joint if it is puffy. °· Use crutches if the painful joint is in your leg. °· Drink enough fluids to keep your pee (urine) clear or pale yellow. °· Follow your doctor's diet instructions. °· Use cold packs for very bad joint pain for 10 to 15 minutes every hour. Ask your doctor if it is okay for you to use hot packs. °· Exercise as told by your doctor. °· Take a warm shower if you have stiffness in the morning. °· Move your sore joints throughout the day. °GET HELP RIGHT AWAY IF:  °· You have a fever. °· You have very bad joint pain, puffiness, or redness. °· You have many joints that are painful and puffy. °· You are not getting better with treatment. °· You have very bad back pain or leg weakness. °· You cannot control when you poop (bowel movement) or pee (urinate). °· You do not feel better in 24 hours or are getting worse. °· You are having side effects from your medicine. °MAKE SURE YOU:  °· Understand these instructions. °· Will watch your condition. °· Will get help right away if you are not doing well or get worse. °Document Released: 08/11/2009 Document Revised: 11/16/2011 Document Reviewed: 08/11/2009 °[employer]® Patient Information ©2015 [employer]. This information is not intended to replace advice given to you by your health care provider. Make sure you discuss any questions you have with your health care provider. ° °

## 2015-02-03 NOTE — ED Notes (Signed)
Pt reports to the ED for eval of bilateral legs, bilateral arms, bilateral hands, and waist pain. He reports he has been having increased in pain x 2 weeks. Denies any injury. Has hx of rheumatoid arthritis and gout. He reports his symptoms began after he started taking Colchicine. Pt denies any CP, SOB, N/V, lightheadedness, or dizziness. Pt A&Ox4, resp e/u, and skin warm and dry.

## 2015-02-03 NOTE — ED Notes (Signed)
Pts family reports the pt has not been taking his Enbrel for 3 wks per his rheumatologist order, pt reports increased pain since then

## 2015-02-03 NOTE — ED Provider Notes (Addendum)
CSN: [id number]     Arrival date & time 02/03/15  1709 History   First MD Initiated Contact with Patient 02/03/15 1725     Chief Complaint  Patient presents with  . Pain      HPI  Patient presents for evaluation of joint pain. He recently moved here from [place]. He has a primary care physician and a rheumatologist. He states he was on Enbrel, prednisone for many years for his arthritis. He states that his rheumatologist here wanted to trial him off of his Humira and prednisone, and try him on colchicine to see if it was gout. It happened 4 weeks ago. He has had increasing pain since that time. He started taking some prednisone week ago but only had 3 days worth. He was planning on seeing them this week and he has appointment in 2 days states he is miserable and cannot sleep because of joint pain. His chart had" he walking because of left knee pain. No fevers no chills. No nausea or vomiting. No other symptoms.  Past Medical History  Diagnosis Date  . Diabetes mellitus without complication   . Rheumatoid arthritis   . High cholesterol   . Coronary artery disease   . Hyperlipidemia 12/13/2014  . Cancer   . Hypertension    Past Surgical History  Procedure Laterality Date  . Coronary stent placement    . Brain surgery     No family history on file. Social History  Substance Use Topics  . Smoking status: Current Every Day Smoker -- 0.50 packs/day    Types: Cigarettes    Last Attempt to Quit: 12/11/2014  . Smokeless tobacco: None  . Alcohol Use: Yes     Comment: occasional    Review of Systems  Constitutional: Negative for fever, chills, diaphoresis, appetite change and fatigue.  HENT: Negative for mouth sores, sore throat and trouble swallowing.   Eyes: Negative for visual disturbance.  Respiratory: Negative for cough, chest tightness, shortness of breath and wheezing.   Cardiovascular: Negative for chest pain.  Gastrointestinal: Negative for nausea, vomiting, abdominal pain,  diarrhea and abdominal distention.  Endocrine: Negative for polydipsia, polyphagia and polyuria.  Genitourinary: Negative for dysuria, frequency and hematuria.  Musculoskeletal: Positive for joint swelling and arthralgias. Negative for gait problem.  Skin: Negative for color change, pallor and rash.  Neurological: Negative for dizziness, syncope, light-headedness and headaches.  Hematological: Does not bruise/bleed easily.  Psychiatric/Behavioral: Negative for behavioral problems and confusion.      Allergies  Amoxicillin  Home Medications   Prior to Admission medications   Medication Sig Start Date End Date Taking? Authorizing Provider  colchicine 0.6 MG tablet Take 0.6 mg by mouth 2 (two) times daily as needed. 01/06/15  Yes Historical Provider, MD  fenofibrate (TRICOR) 48 MG tablet Take 48 mg by mouth daily.   Yes Historical Provider, MD  folic acid (FOLVITE) 1 MG tablet Take 1 mg by mouth daily.   Yes Historical Provider, MD  hydrochlorothiazide (HYDRODIURIL) 25 MG tablet Take 1 tablet (25 mg total) by mouth daily. 01/10/15  Yes Lance Bosch, NP  lisinopril (PRINIVIL,ZESTRIL) 2.5 MG tablet Take 1 tablet (2.5 mg total) by mouth daily. 01/31/15  Yes Lance Bosch, NP  metFORMIN (GLUCOPHAGE) 1000 MG tablet Take 1 tablet (1,000 mg total) by mouth daily with breakfast. Patient taking differently: Take 1,000 mg by mouth 2 (two) times daily with a meal.  12/20/14  Yes Arnoldo Morale, MD  metoprolol tartrate (LOPRESSOR) 25 MG  tablet Take 1 tablet (25 mg total) by mouth daily. 01/10/15  Yes Lance Bosch, NP  Multiple Vitamin (MULTIVITAMIN) tablet Take 1 tablet by mouth daily.   Yes Historical Provider, MD  rosuvastatin (CRESTOR) 5 MG tablet Take 5 mg by mouth daily.   Yes Historical Provider, MD  budesonide-formoterol (SYMBICORT) 160-4.5 MCG/ACT inhaler Inhale 2 puffs into the lungs 2 (two) times daily. Patient not taking: Reported on 01/10/2015 12/15/14   Eugenie Filler, MD  etanercept  (ENBREL SURECLICK) 50 MG/ML injection Inject 50 mg into the skin once a week. On Sundays    Historical Provider, MD  glucose blood (TRUETEST TEST) test strip Check sugars twice per day for E11.9 01/10/15   Lance Bosch, NP  guaiFENesin (MUCINEX) 600 MG 12 hr tablet Take 2 tablets (1,200 mg total) by mouth 2 (two) times daily. Take for 4 days then stop. 12/15/14   Eugenie Filler, MD  HYDROcodone-acetaminophen (NORCO/VICODIN) 5-325 MG per tablet Take 2 tablets by mouth every 4 (four) hours as needed. 02/03/15   Tanna Furry, MD  levalbuterol Penne Lash) 0.63 MG/3ML nebulizer solution Take 3 mLs (0.63 mg total) by nebulization every 6 (six) hours as needed for wheezing or shortness of breath. Use 3 times daily x 4 days, then every 6 hours as needed for SOB. 12/15/14   Eugenie Filler, MD  predniSONE (DELTASONE) 20 MG tablet Take 1 tablet (20 mg total) by mouth daily. 02/03/15   Tanna Furry, MD  tiotropium (SPIRIVA HANDIHALER) 18 MCG inhalation capsule Place 1 capsule (18 mcg total) into inhaler and inhale daily. 12/15/14   Eugenie Filler, MD   BP 95/56 mmHg  Pulse 109  Temp(Src) 99.7 F (37.6 C) (Oral)  Resp 22  SpO2 95% Physical Exam  Constitutional: He is oriented to person, place, and time. He appears well-developed and well-nourished. No distress.  HENT:  Head: Normocephalic.  Eyes: Conjunctivae are normal. Pupils are equal, round, and reactive to light. No scleral icterus.  Neck: Normal range of motion. Neck supple. No thyromegaly present.  Cardiovascular: Normal rate and regular rhythm.  Exam reveals no gallop and no friction rub.   No murmur heard. Pulmonary/Chest: Effort normal and breath sounds normal. No respiratory distress. He has no wheezes. He has no rales.  Abdominal: Soft. Bowel sounds are normal. He exhibits no distension. There is no tenderness. There is no rebound.  Musculoskeletal: Normal range of motion.  Pain and tenderness to the hands wrists elbows shoulders and knees. No  obvious effusions. No erythema or warmth.  Neurological: He is alert and oriented to person, place, and time.  Skin: Skin is warm and dry. No rash noted.  Psychiatric: He has a normal mood and affect. His behavior is normal.    ED Course  Procedures (including critical care time) Labs Review Labs Reviewed - No data to display  Imaging Review No results found. I have personally reviewed and evaluated these images and lab results as part of my medical decision-making.   EKG Interpretation None      MDM   Final diagnoses:  Arthritis pain    Given by mouth Percocet, and by mouth prednisone.Marland Kitchen Heart rate is improved. Blood pressure 938 systolic. Afebrile here able to walk. He is appropriate for discharge with symptomatic treatment. Placed back on prednisone for the next 2 days limited number of hydrocodone for his pain until he sees his rheumatologist in 2 days as well.    Tanna Furry, MD 02/03/15 Ladoris Gene  Elta Guadeloupe  Jeneen Rinks, MD 02/03/15 [phone]

## 2015-02-06 ENCOUNTER — Telehealth: Payer: Self-pay | Admitting: Internal Medicine

## 2015-02-06 NOTE — Telephone Encounter (Signed)
Patient's family member called requesting a new meter and test strips, patient states test strips that were previously used are discontinued so is now needing new test strips. Please f/u

## 2015-02-07 ENCOUNTER — Other Ambulatory Visit: Payer: Self-pay | Admitting: *Deleted

## 2015-02-07 MED ORDER — FOLIC ACID 1 MG PO TABS: 1.0000 mg | ORAL_TABLET | Freq: Every day | ORAL | Status: AC

## 2015-02-07 MED ORDER — GLUCOSE BLOOD VI STRP: ORAL_STRIP | Status: AC

## 2015-02-07 MED ORDER — ONETOUCH BASIC SYSTEM W/DEVICE KIT: PACK | Status: AC

## 2015-02-13 ENCOUNTER — Other Ambulatory Visit: Payer: Self-pay | Admitting: Internal Medicine

## 2015-02-14 ENCOUNTER — Encounter: Payer: Self-pay | Admitting: Internal Medicine

## 2015-02-14 ENCOUNTER — Ambulatory Visit: Payer: Medicare Other | Attending: Internal Medicine | Admitting: Internal Medicine

## 2015-02-14 VITALS — BP 116/76 | HR 98 | Temp 98.3°F | Resp 16 | Ht 69.0 in | Wt 226.0 lb

## 2015-02-14 DIAGNOSIS — M069 Rheumatoid arthritis, unspecified: Secondary | ICD-10-CM

## 2015-02-14 DIAGNOSIS — M25561 Pain in right knee: Secondary | ICD-10-CM | POA: Diagnosis not present

## 2015-02-14 DIAGNOSIS — E119 Type 2 diabetes mellitus without complications: Secondary | ICD-10-CM

## 2015-02-14 LAB — GLUCOSE, POCT (MANUAL RESULT ENTRY): POC GLUCOSE: 209 mg/dL — AB (ref 70–99)

## 2015-02-14 MED ORDER — TRAMADOL HCL 50 MG PO TABS: 50.0000 mg | ORAL_TABLET | Freq: Three times a day (TID) | ORAL | Status: DC | PRN

## 2015-02-14 NOTE — Patient Instructions (Signed)
Call Dr. Charlestine Night back and ask him if you are suppose to start back on Enbrel because it seemed to help your pain. Let him know that the pain you are having is really affecting your mobility. If not back on Enbrel, then would a joint injection in the knee help?

## 2015-02-14 NOTE — Progress Notes (Signed)
Patient here for follow up on his dm Patient complains of bilateral leg pain and also having Pain to both hands

## 2015-02-14 NOTE — Progress Notes (Signed)
Patient ID: Jorge Malone, male   DOB: 26-Mar-1941, 74 y.o.   MRN: [id number]  CC: pain   HPI: Jorge Malone is a 74 y.o. male here today for a follow up visit.  Patient has past medical history of rheumatoid arthritis, DM, HLD, HTN, CAD. Patient was seen in the ER on 02/03/15 for pain in his hands, wrist, and legs. He was given a prescription for Vicodin and predisone until he follows up with his Rheumatologist. He states that he went back to the Rheumatologist yesterday, his prednisone was increased to 30 mg once daily. He states that his legs have been hurting him so bad that he cannot walk.  States that he ate Shrimp few days ago and noticed more swelling in hands and knees after that. He is requested a allergy test.   Patient has No headache, No chest pain, No abdominal pain - No Nausea, No new weakness tingling or numbness, No Cough - SOB.  Allergies  Allergen Reactions  . Amoxicillin Nausea Only and Other (See Comments)    GI problems Pt tolerates cephalosporins   Past Medical History  Diagnosis Date  . Diabetes mellitus without complication   . Rheumatoid arthritis   . High cholesterol   . Coronary artery disease   . Hyperlipidemia 12/13/2014  . Cancer   . Hypertension    Current Outpatient Prescriptions on File Prior to Visit  Medication Sig Dispense Refill  . colchicine 0.6 MG tablet Take 0.6 mg by mouth 2 (two) times daily as needed.  1  . fenofibrate (TRICOR) 48 MG tablet Take 48 mg by mouth daily.    . folic acid (FOLVITE) 1 MG tablet Take 1 tablet (1 mg total) by mouth daily. 30 tablet 2  . hydrochlorothiazide (HYDRODIURIL) 25 MG tablet Take 1 tablet (25 mg total) by mouth daily. 30 tablet 3  . lisinopril (PRINIVIL,ZESTRIL) 2.5 MG tablet Take 1 tablet (2.5 mg total) by mouth daily. 30 tablet 5  . metFORMIN (GLUCOPHAGE) 1000 MG tablet Take 1 tablet (1,000 mg total) by mouth daily with breakfast. (Patient taking differently: Take 1,000 mg by mouth 2 (two) times daily with a meal.  ) 60 tablet 2  . metoprolol tartrate (LOPRESSOR) 25 MG tablet Take 1 tablet (25 mg total) by mouth daily. 30 tablet 3  . predniSONE (DELTASONE) 20 MG tablet Take 1 tablet (20 mg total) by mouth daily. 10 tablet 0  . Blood Glucose Monitoring Suppl (ONE TOUCH BASIC SYSTEM) W/DEVICE KIT TID and QHS 1 each 0  . budesonide-formoterol (SYMBICORT) 160-4.5 MCG/ACT inhaler Inhale 2 puffs into the lungs 2 (two) times daily. (Patient not taking: Reported on 01/10/2015) 1 Inhaler 0  . etanercept (ENBREL SURECLICK) 50 MG/ML injection Inject 50 mg into the skin once a week. On Sundays    . glucose blood (TRUETEST TEST) test strip Check sugars twice per day for E11.9 100 each 12  . glucose blood test strip Use as instructed 100 each 12  . guaiFENesin (MUCINEX) 600 MG 12 hr tablet Take 2 tablets (1,200 mg total) by mouth 2 (two) times daily. Take for 4 days then stop. 16 tablet 0  . HYDROcodone-acetaminophen (NORCO/VICODIN) 5-325 MG per tablet Take 2 tablets by mouth every 4 (four) hours as needed. 10 tablet 0  . levalbuterol (XOPENEX) 0.63 MG/3ML nebulizer solution Take 3 mLs (0.63 mg total) by nebulization every 6 (six) hours as needed for wheezing or shortness of breath. Use 3 times daily x 4 days, then every 6 hours as  needed for SOB. 360 mL 3  . Multiple Vitamin (MULTIVITAMIN) tablet Take 1 tablet by mouth daily.    . rosuvastatin (CRESTOR) 5 MG tablet Take 5 mg by mouth daily.    Marland Kitchen tiotropium (SPIRIVA HANDIHALER) 18 MCG inhalation capsule Place 1 capsule (18 mcg total) into inhaler and inhale daily. 30 capsule 2   No current facility-administered medications on file prior to visit.   History reviewed. No pertinent family history. Social History   Social History  . Marital Status: Single    Spouse Name: N/A  . Number of Children: N/A  . Years of Education: N/A   Occupational History  . Not on file.   Social History Main Topics  . Smoking status: Current Every Day Smoker -- 0.50 packs/day    Types:  Cigarettes    Last Attempt to Quit: 12/11/2014  . Smokeless tobacco: Not on file  . Alcohol Use: Yes     Comment: occasional  . Drug Use: No  . Sexual Activity: Not on file   Other Topics Concern  . Not on file   Social History Narrative    Review of Systems: Other than what is stated in HPI, all other systems are negative.   Objective:   Filed Vitals:   02/14/15 1443  BP: 116/76  Pulse: 98  Temp: 98.3 F (36.8 C)  Resp: 16    Physical Exam  Constitutional: He is oriented to person, place, and time.  Cardiovascular: Normal rate, regular rhythm and normal heart sounds.   Pulmonary/Chest: Effort normal and breath sounds normal.  Musculoskeletal: He exhibits edema and tenderness (knees).  Swelling of BUE and bilateral knees  Neurological: He is alert and oriented to person, place, and time.  Skin: Skin is warm. No rash noted. No erythema.     Lab Results  Component Value Date   WBC 11.5* 01/03/2015   HGB 14.3 01/03/2015   HCT 44.5 01/03/2015   MCV 93.3 01/03/2015   PLT 241 01/03/2015   Lab Results  Component Value Date   CREATININE 1.24 01/03/2015   BUN 14 01/03/2015   NA 140 01/03/2015   K 3.6 01/03/2015   CL 100* 01/03/2015   CO2 28 01/03/2015    Lab Results  Component Value Date   HGBA1C 6.80 12/20/2014   Lipid Panel  No results found for: CHOL, TRIG, HDL, CHOLHDL, VLDL, LDLCALC     Assessment and plan:   [name] was seen today for follow-up.  Diagnoses and all orders for this visit:  Rheumatoid arthritis Stable. I have advised patient to call Rheumatologist back to see if he is due to start back on Enbrel. He reports that his pain was never this severe on Enbrel.   Right knee pain -     Refill traMADol (ULTRAM) 50 MG tablet; Take 1 tablet (50 mg total) by mouth every 8 (eight) hours as needed.  Type 2 diabetes mellitus without complication -     Glucose (CBG)   Return for November--DM f/u.       Lance Bosch, [facility] [phone] 02/14/2015, 2:48 PM

## 2015-02-21 ENCOUNTER — Telehealth: Payer: Self-pay

## 2015-02-21 NOTE — Telephone Encounter (Signed)
-----   Message from Lance Bosch, NP sent at 02/19/2015  2:10 PM EDT ----- Regarding: FW: Rx refill Contact: [phone] I want patient to come have cholesterol checked before I refill tricor. He may not need it anymore  ----- Message -----    From: Rea College, CMA    Sent: 02/19/2015  11:15 AM      To: Lance Bosch, NP, Dorothe Pea, LPN Subject: Rx refill                                      [facility] requesting a refill of Pt's Tricor 48mg . I verified that Pt takes medication, although it wasn't prescribed by you, but during Pt ED visit. Please advise.

## 2015-02-21 NOTE — Telephone Encounter (Signed)
Attempted to call patient about refilling his cholesterol medication His provider would like him to have blood work before we refill it Patient not available  Left message on voice mail to return our call

## 2015-03-04 ENCOUNTER — Telehealth: Payer: Self-pay

## 2015-03-04 MED ORDER — LISINOPRIL 2.5 MG PO TABS: 2.5000 mg | ORAL_TABLET | Freq: Every day | ORAL | Status: DC

## 2015-03-04 NOTE — Telephone Encounter (Signed)
Attempted to contact patient ] Patient unavailable Left message on voice mail to return our call Prescription for lisinopril sent to pharmacy on file

## 2015-03-04 NOTE — Telephone Encounter (Signed)
-----   Message from Nila Nephew, RN sent at 03/03/2015  2:04 PM EDT -----   ----- Message -----    From: Lance Bosch, NP    Sent: 01/19/2015   8:04 PM      To: Nila Nephew, RN  Patient has high level of protein in urine. Please inform patient that I would like to put him on lisinopril 2.5 mg daily to help protect his kidneys and lower this level. Please inform that this medication may cause a dry cough. Please send order. Have him come back 2 weeks after beginning medication to have his BP checked.

## 2015-03-05 ENCOUNTER — Ambulatory Visit: Payer: Medicare Other | Admitting: Cardiology

## 2015-03-13 NOTE — Telephone Encounter (Signed)
Harriet Pho and RN from [facility] called stating he needed the pt. Most recent A1C and blood pressure. Please f/u   Harriet Pho- [place]

## 2015-03-18 NOTE — Telephone Encounter (Signed)
Harriet Pho RN from [facility] called requesting to know the pt. Most recent  A1C and blood pressure check.  The pt. Is currently enrolled in the Diabetes program with [facility]. Please f/u.  Ext. [phone]

## 2015-03-19 ENCOUNTER — Ambulatory Visit: Payer: Medicare Other | Admitting: Cardiology

## 2015-04-04 ENCOUNTER — Inpatient Hospital Stay (HOSPITAL_COMMUNITY)
Admission: EM | Admit: 2015-04-04 | Discharge: 2015-04-08 | DRG: 871 | Disposition: A | Discharge status: Skilled Nursing Facility | Payer: Medicare Other | Attending: Internal Medicine | Admitting: Internal Medicine

## 2015-04-04 ENCOUNTER — Inpatient Hospital Stay (HOSPITAL_COMMUNITY): Payer: Medicare Other

## 2015-04-04 ENCOUNTER — Encounter (HOSPITAL_COMMUNITY): Payer: Self-pay | Admitting: Emergency Medicine

## 2015-04-04 ENCOUNTER — Other Ambulatory Visit: Payer: Self-pay

## 2015-04-04 ENCOUNTER — Other Ambulatory Visit: Payer: Self-pay | Admitting: Family Medicine

## 2015-04-04 ENCOUNTER — Emergency Department (HOSPITAL_COMMUNITY): Payer: Medicare Other

## 2015-04-04 DIAGNOSIS — I2699 Other pulmonary embolism without acute cor pulmonale: Secondary | ICD-10-CM | POA: Diagnosis present

## 2015-04-04 DIAGNOSIS — Z7952 Long term (current) use of systemic steroids: Secondary | ICD-10-CM

## 2015-04-04 DIAGNOSIS — J449 Chronic obstructive pulmonary disease, unspecified: Secondary | ICD-10-CM | POA: Diagnosis present

## 2015-04-04 DIAGNOSIS — Z7984 Long term (current) use of oral hypoglycemic drugs: Secondary | ICD-10-CM

## 2015-04-04 DIAGNOSIS — Z881 Allergy status to other antibiotic agents status: Secondary | ICD-10-CM | POA: Diagnosis not present

## 2015-04-04 DIAGNOSIS — M069 Rheumatoid arthritis, unspecified: Secondary | ICD-10-CM | POA: Diagnosis present

## 2015-04-04 DIAGNOSIS — I110 Hypertensive heart disease with heart failure: Secondary | ICD-10-CM | POA: Diagnosis present

## 2015-04-04 DIAGNOSIS — M25561 Pain in right knee: Secondary | ICD-10-CM | POA: Diagnosis present

## 2015-04-04 DIAGNOSIS — R000 Tachycardia, unspecified: Secondary | ICD-10-CM

## 2015-04-04 DIAGNOSIS — E119 Type 2 diabetes mellitus without complications: Secondary | ICD-10-CM | POA: Diagnosis present

## 2015-04-04 DIAGNOSIS — Z955 Presence of coronary angioplasty implant and graft: Secondary | ICD-10-CM

## 2015-04-04 DIAGNOSIS — I10 Essential (primary) hypertension: Secondary | ICD-10-CM | POA: Diagnosis present

## 2015-04-04 DIAGNOSIS — K5900 Constipation, unspecified: Secondary | ICD-10-CM | POA: Diagnosis present

## 2015-04-04 DIAGNOSIS — M25469 Effusion, unspecified knee: Secondary | ICD-10-CM | POA: Diagnosis present

## 2015-04-04 DIAGNOSIS — I5032 Chronic diastolic (congestive) heart failure: Secondary | ICD-10-CM | POA: Diagnosis present

## 2015-04-04 DIAGNOSIS — F17210 Nicotine dependence, cigarettes, uncomplicated: Secondary | ICD-10-CM | POA: Diagnosis present

## 2015-04-04 DIAGNOSIS — Z79891 Long term (current) use of opiate analgesic: Secondary | ICD-10-CM

## 2015-04-04 DIAGNOSIS — Z79899 Other long term (current) drug therapy: Secondary | ICD-10-CM | POA: Diagnosis not present

## 2015-04-04 DIAGNOSIS — I2510 Atherosclerotic heart disease of native coronary artery without angina pectoris: Secondary | ICD-10-CM | POA: Diagnosis present

## 2015-04-04 DIAGNOSIS — E872 Acidosis, unspecified: Secondary | ICD-10-CM | POA: Diagnosis present

## 2015-04-04 DIAGNOSIS — E7800 Pure hypercholesterolemia, unspecified: Secondary | ICD-10-CM | POA: Diagnosis present

## 2015-04-04 DIAGNOSIS — N3289 Other specified disorders of bladder: Secondary | ICD-10-CM | POA: Diagnosis present

## 2015-04-04 DIAGNOSIS — N329 Bladder disorder, unspecified: Secondary | ICD-10-CM | POA: Diagnosis present

## 2015-04-04 DIAGNOSIS — A419 Sepsis, unspecified organism: Secondary | ICD-10-CM | POA: Diagnosis present

## 2015-04-04 DIAGNOSIS — M25461 Effusion, right knee: Secondary | ICD-10-CM | POA: Diagnosis not present

## 2015-04-04 DIAGNOSIS — Z720 Tobacco use: Secondary | ICD-10-CM | POA: Diagnosis present

## 2015-04-04 DIAGNOSIS — E785 Hyperlipidemia, unspecified: Secondary | ICD-10-CM | POA: Diagnosis present

## 2015-04-04 DIAGNOSIS — R1011 Right upper quadrant pain: Secondary | ICD-10-CM | POA: Diagnosis not present

## 2015-04-04 DIAGNOSIS — R109 Unspecified abdominal pain: Secondary | ICD-10-CM

## 2015-04-04 DIAGNOSIS — W19XXXA Unspecified fall, initial encounter: Secondary | ICD-10-CM | POA: Diagnosis present

## 2015-04-04 LAB — I-STAT CHEM 8, ED
BUN: 15 mg/dL (ref 6–20)
Calcium, Ion: 1.12 mmol/L — ABNORMAL LOW (ref 1.13–1.30)
Chloride: 102 mmol/L (ref 101–111)
Creatinine, Ser: 1 mg/dL (ref 0.61–1.24)
Glucose, Bld: 146 mg/dL — ABNORMAL HIGH (ref 65–99)
HEMATOCRIT: 46 % (ref 39.0–52.0)
Hemoglobin: 15.6 g/dL (ref 13.0–17.0)
POTASSIUM: 3.9 mmol/L (ref 3.5–5.1)
SODIUM: 138 mmol/L (ref 135–145)
TCO2: 22 mmol/L (ref 0–100)

## 2015-04-04 LAB — COMPREHENSIVE METABOLIC PANEL
ALK PHOS: 55 U/L (ref 38–126)
ALT: 15 U/L — AB (ref 17–63)
AST: 21 U/L (ref 15–41)
Albumin: 3.1 g/dL — ABNORMAL LOW (ref 3.5–5.0)
Anion gap: 14 (ref 5–15)
BILIRUBIN TOTAL: 1.1 mg/dL (ref 0.3–1.2)
BUN: 13 mg/dL (ref 6–20)
CO2: 23 mmol/L (ref 22–32)
Calcium: 9.5 mg/dL (ref 8.9–10.3)
Chloride: 101 mmol/L (ref 101–111)
Creatinine, Ser: 1.05 mg/dL (ref 0.61–1.24)
GFR calc Af Amer: >60 mL/min (ref >60)
GFR calc non Af Amer: >60 mL/min (ref >60)
GLUCOSE: 147 mg/dL — AB (ref 65–99)
Potassium: 4.1 mmol/L (ref 3.5–5.1)
Sodium: 138 mmol/L (ref 135–145)
TOTAL PROTEIN: 6.9 g/dL (ref 6.5–8.1)

## 2015-04-04 LAB — INFLUENZA PANEL BY PCR (TYPE A & B)
H1N1 flu by pcr: NOT DETECTED
INFLAPCR: NEGATIVE
Influenza B By PCR: NEGATIVE

## 2015-04-04 LAB — URINALYSIS, ROUTINE W REFLEX MICROSCOPIC
Bilirubin Urine: NEGATIVE
GLUCOSE, UA: NEGATIVE mg/dL
Hgb urine dipstick: NEGATIVE
KETONES UR: NEGATIVE mg/dL
LEUKOCYTES UA: NEGATIVE
NITRITE: NEGATIVE
Protein, ur: NEGATIVE mg/dL
SPECIFIC GRAVITY, URINE: 1.046 — AB (ref 1.005–1.030)
Urobilinogen, UA: 0.2 mg/dL (ref 0.0–1.0)
pH: 5 (ref 5.0–8.0)

## 2015-04-04 LAB — CK TOTAL AND CKMB (NOT AT ARMC)
CK TOTAL: 20 U/L — AB (ref 49–397)
CK, MB: 0.8 ng/mL (ref 0.5–5.0)
CK, MB: 1.4 ng/mL (ref 0.5–5.0)
CK, MB: 1.4 ng/mL (ref 0.5–5.0)
RELATIVE INDEX: INVALID (ref 0.0–2.5)
Relative Index: INVALID (ref 0.0–2.5)
Relative Index: INVALID (ref 0.0–2.5)
Total CK: 23 U/L — ABNORMAL LOW (ref 49–397)
Total CK: 26 U/L — ABNORMAL LOW (ref 49–397)

## 2015-04-04 LAB — C-REACTIVE PROTEIN: CRP: 14.6 mg/dL — AB (ref <1.0)

## 2015-04-04 LAB — CBC WITH DIFFERENTIAL/PLATELET
Basophils Absolute: 0 10*3/uL (ref 0.0–0.1)
Basophils Relative: 0 %
EOS ABS: 0.1 10*3/uL (ref 0.0–0.7)
Eosinophils Relative: 1 %
HCT: 43.5 % (ref 39.0–52.0)
Hemoglobin: 14.1 g/dL (ref 13.0–17.0)
LYMPHS ABS: 1.5 10*3/uL (ref 0.7–4.0)
Lymphocytes Relative: 11 %
MCH: 29.8 pg (ref 26.0–34.0)
MCHC: 32.4 g/dL (ref 30.0–36.0)
MCV: 92 fL (ref 78.0–100.0)
Monocytes Absolute: 1.1 10*3/uL — ABNORMAL HIGH (ref 0.1–1.0)
Monocytes Relative: 8 %
NEUTROS PCT: 80 %
Neutro Abs: 11.1 10*3/uL — ABNORMAL HIGH (ref 1.7–7.7)
PLATELETS: 205 10*3/uL (ref 150–400)
RBC: 4.73 MIL/uL (ref 4.22–5.81)
RDW: 15.5 % (ref 11.5–15.5)
WBC: 13.8 10*3/uL — ABNORMAL HIGH (ref 4.0–10.5)

## 2015-04-04 LAB — I-STAT CG4 LACTIC ACID, ED
LACTIC ACID, VENOUS: 2.85 mmol/L — AB (ref 0.5–2.0)
LACTIC ACID, VENOUS: 1.54 mmol/L (ref 0.5–2.0)

## 2015-04-04 LAB — GLUCOSE, CAPILLARY
GLUCOSE-CAPILLARY: 120 mg/dL — AB (ref 65–99)
Glucose-Capillary: 115 mg/dL — ABNORMAL HIGH (ref 65–99)
Glucose-Capillary: 137 mg/dL — ABNORMAL HIGH (ref 65–99)
Glucose-Capillary: 143 mg/dL — ABNORMAL HIGH (ref 65–99)

## 2015-04-04 LAB — PROCALCITONIN: Procalcitonin: <0.1 ng/mL

## 2015-04-04 LAB — TROPONIN I: TROPONIN I: 0.03 ng/mL (ref <0.031)

## 2015-04-04 LAB — URIC ACID: Uric Acid, Serum: 6.6 mg/dL (ref 4.4–7.6)

## 2015-04-04 LAB — LIPASE, BLOOD: Lipase: 57 U/L — ABNORMAL HIGH (ref 11–51)

## 2015-04-04 LAB — SEDIMENTATION RATE: SED RATE: 54 mm/h — AB (ref 0–16)

## 2015-04-04 MED ORDER — LEVOFLOXACIN IN D5W 750 MG/150ML IV SOLN
750.0000 mg | Freq: Once | INTRAVENOUS | Status: AC
  Administered 2015-04-04: 750 mg via INTRAVENOUS
  Filled 2015-04-04: qty 150

## 2015-04-04 MED ORDER — SODIUM CHLORIDE 0.9 % IV BOLUS (SEPSIS)
1000.0000 mL | INTRAVENOUS | Status: AC
  Administered 2015-04-04 (×2): 1000 mL via INTRAVENOUS

## 2015-04-04 MED ORDER — DIPHENHYDRAMINE HCL 25 MG PO CAPS
25.0000 mg | ORAL_CAPSULE | Freq: Four times a day (QID) | ORAL | Status: DC | PRN
  Filled 2015-04-04: qty 1

## 2015-04-04 MED ORDER — INSULIN ASPART 100 UNIT/ML ~~LOC~~ SOLN
0.0000 [IU] | Freq: Three times a day (TID) | SUBCUTANEOUS | Status: DC
  Administered 2015-04-04 (×2): 1 [IU] via SUBCUTANEOUS
  Administered 2015-04-05: 2 [IU] via SUBCUTANEOUS
  Administered 2015-04-06: 3 [IU] via SUBCUTANEOUS
  Administered 2015-04-06 – 2015-04-07 (×2): 2 [IU] via SUBCUTANEOUS
  Administered 2015-04-08: 1 [IU] via SUBCUTANEOUS

## 2015-04-04 MED ORDER — INSULIN ASPART 100 UNIT/ML ~~LOC~~ SOLN
0.0000 [IU] | Freq: Every day | SUBCUTANEOUS | Status: DC
  Administered 2015-04-07: 2 [IU] via SUBCUTANEOUS

## 2015-04-04 MED ORDER — HYDROCODONE-ACETAMINOPHEN 5-325 MG PO TABS
2.0000 | ORAL_TABLET | Freq: Once | ORAL | Status: DC
  Filled 2015-04-04: qty 2

## 2015-04-04 MED ORDER — HEPARIN (PORCINE) IN NACL 100-0.45 UNIT/ML-% IJ SOLN
1600.0000 [IU]/h | INTRAMUSCULAR | Status: DC
  Administered 2015-04-04: 1600 [IU]/h via INTRAVENOUS
  Filled 2015-04-04: qty 250

## 2015-04-04 MED ORDER — ENOXAPARIN SODIUM 60 MG/0.6ML ~~LOC~~ SOLN
0.5000 mg/kg | SUBCUTANEOUS | Status: DC
  Administered 2015-04-04: 50 mg via SUBCUTANEOUS
  Filled 2015-04-04: qty 0.6

## 2015-04-04 MED ORDER — DEXTROSE 5 % IV SOLN
1.0000 g | Freq: Three times a day (TID) | INTRAVENOUS | Status: DC
  Administered 2015-04-04 – 2015-04-07 (×9): 1 g via INTRAVENOUS
  Filled 2015-04-04 (×13): qty 1

## 2015-04-04 MED ORDER — SODIUM CHLORIDE 0.9 % IV SOLN
2000.0000 mg | Freq: Once | INTRAVENOUS | Status: AC
  Administered 2015-04-04: 2000 mg via INTRAVENOUS
  Filled 2015-04-04: qty 2000

## 2015-04-04 MED ORDER — ROSUVASTATIN CALCIUM 5 MG PO TABS
5.0000 mg | ORAL_TABLET | Freq: Every day | ORAL | Status: DC
  Administered 2015-04-04 – 2015-04-08 (×5): 5 mg via ORAL
  Filled 2015-04-04 (×5): qty 1

## 2015-04-04 MED ORDER — SODIUM CHLORIDE 0.9 % IV SOLN: INTRAVENOUS | Status: DC

## 2015-04-04 MED ORDER — METOPROLOL TARTRATE 25 MG PO TABS
25.0000 mg | ORAL_TABLET | Freq: Every day | ORAL | Status: DC
  Administered 2015-04-04 – 2015-04-08 (×5): 25 mg via ORAL
  Filled 2015-04-04 (×5): qty 1

## 2015-04-04 MED ORDER — PREDNISONE 20 MG PO TABS
20.0000 mg | ORAL_TABLET | Freq: Every day | ORAL | Status: DC
  Administered 2015-04-04 – 2015-04-05 (×2): 20 mg via ORAL
  Filled 2015-04-04 (×2): qty 1

## 2015-04-04 MED ORDER — SODIUM CHLORIDE 0.9 % IV BOLUS (SEPSIS): 500.0000 mL | INTRAVENOUS | Status: AC

## 2015-04-04 MED ORDER — ONDANSETRON HCL 4 MG PO TABS: 4.0000 mg | ORAL_TABLET | Freq: Four times a day (QID) | ORAL | Status: DC | PRN

## 2015-04-04 MED ORDER — ACETAMINOPHEN 650 MG RE SUPP: 650.0000 mg | Freq: Four times a day (QID) | RECTAL | Status: DC | PRN

## 2015-04-04 MED ORDER — TIOTROPIUM BROMIDE MONOHYDRATE 18 MCG IN CAPS
18.0000 ug | ORAL_CAPSULE | Freq: Every day | RESPIRATORY_TRACT | Status: DC
  Administered 2015-04-05 – 2015-04-08 (×4): 18 ug via RESPIRATORY_TRACT
  Filled 2015-04-04 (×2): qty 5

## 2015-04-04 MED ORDER — VANCOMYCIN HCL IN DEXTROSE 750-5 MG/150ML-% IV SOLN
750.0000 mg | Freq: Two times a day (BID) | INTRAVENOUS | Status: DC
  Administered 2015-04-04 – 2015-04-07 (×6): 750 mg via INTRAVENOUS
  Filled 2015-04-04 (×7): qty 150

## 2015-04-04 MED ORDER — LEVOFLOXACIN IN D5W 750 MG/150ML IV SOLN
750.0000 mg | INTRAVENOUS | Status: DC
  Administered 2015-04-05 – 2015-04-08 (×4): 750 mg via INTRAVENOUS
  Filled 2015-04-04 (×6): qty 150

## 2015-04-04 MED ORDER — MORPHINE SULFATE (PF) 4 MG/ML IV SOLN
4.0000 mg | Freq: Once | INTRAVENOUS | Status: AC
  Administered 2015-04-04: 4 mg via INTRAVENOUS
  Filled 2015-04-04: qty 1

## 2015-04-04 MED ORDER — SODIUM CHLORIDE 0.9 % IJ SOLN
3.0000 mL | Freq: Two times a day (BID) | INTRAMUSCULAR | Status: DC
  Administered 2015-04-06 – 2015-04-07 (×2): 3 mL via INTRAVENOUS

## 2015-04-04 MED ORDER — HEPARIN BOLUS VIA INFUSION
2500.0000 [IU] | Freq: Once | INTRAVENOUS | Status: AC
  Administered 2015-04-04: 2500 [IU] via INTRAVENOUS
  Filled 2015-04-04: qty 2500

## 2015-04-04 MED ORDER — ONDANSETRON HCL 4 MG/2ML IJ SOLN: 4.0000 mg | Freq: Four times a day (QID) | INTRAMUSCULAR | Status: DC | PRN

## 2015-04-04 MED ORDER — ALBUTEROL SULFATE (2.5 MG/3ML) 0.083% IN NEBU: 2.5000 mg | INHALATION_SOLUTION | RESPIRATORY_TRACT | Status: DC | PRN

## 2015-04-04 MED ORDER — ACETAMINOPHEN 500 MG PO TABS
1000.0000 mg | ORAL_TABLET | Freq: Once | ORAL | Status: AC
  Administered 2015-04-04: 1000 mg via ORAL
  Filled 2015-04-04: qty 2

## 2015-04-04 MED ORDER — VANCOMYCIN HCL IN DEXTROSE 1-5 GM/200ML-% IV SOLN: 1000.0000 mg | Freq: Once | INTRAVENOUS | Status: DC

## 2015-04-04 MED ORDER — ACETAMINOPHEN 325 MG PO TABS
650.0000 mg | ORAL_TABLET | Freq: Four times a day (QID) | ORAL | Status: DC | PRN
  Filled 2015-04-04: qty 2

## 2015-04-04 MED ORDER — IOHEXOL 350 MG/ML SOLN
100.0000 mL | Freq: Once | INTRAVENOUS | Status: AC | PRN
  Administered 2015-04-04: 100 mL via INTRAVENOUS

## 2015-04-04 MED ORDER — SODIUM CHLORIDE 0.9 % IV BOLUS (SEPSIS): 1000.0000 mL | Freq: Once | INTRAVENOUS | Status: DC

## 2015-04-04 MED ORDER — SODIUM CHLORIDE 0.9 % IV SOLN
INTRAVENOUS | Status: DC
  Administered 2015-04-04: 1000 mL via INTRAVENOUS
  Administered 2015-04-04 – 2015-04-05 (×2): via INTRAVENOUS

## 2015-04-04 MED ORDER — FOLIC ACID 1 MG PO TABS
1.0000 mg | ORAL_TABLET | Freq: Every day | ORAL | Status: DC
  Administered 2015-04-04 – 2015-04-08 (×5): 1 mg via ORAL
  Filled 2015-04-04 (×5): qty 1

## 2015-04-04 MED ORDER — HYDROCODONE-ACETAMINOPHEN 5-325 MG PO TABS
1.0000 | ORAL_TABLET | ORAL | Status: DC | PRN
Start: 2015-04-04 — End: 2015-04-08
  Administered 2015-04-04 – 2015-04-06 (×4): 2 via ORAL
  Filled 2015-04-04 (×4): qty 2

## 2015-04-04 MED ORDER — FENOFIBRATE 54 MG PO TABS
54.0000 mg | ORAL_TABLET | Freq: Every day | ORAL | Status: DC
  Administered 2015-04-04 – 2015-04-08 (×5): 54 mg via ORAL
  Filled 2015-04-04 (×6): qty 1

## 2015-04-04 MED ORDER — HYDROMORPHONE HCL 1 MG/ML IJ SOLN
1.0000 mg | INTRAMUSCULAR | Status: DC | PRN
  Administered 2015-04-05: 1 mg via INTRAVENOUS
  Filled 2015-04-04 (×2): qty 1

## 2015-04-04 MED ORDER — COLCHICINE 0.6 MG PO TABS
0.6000 mg | ORAL_TABLET | Freq: Every day | ORAL | Status: DC
  Administered 2015-04-04 – 2015-04-08 (×5): 0.6 mg via ORAL
  Filled 2015-04-04 (×5): qty 1

## 2015-04-04 MED ORDER — DEXTROSE 5 % IV SOLN
2.0000 g | Freq: Once | INTRAVENOUS | Status: AC
  Administered 2015-04-04: 2 g via INTRAVENOUS
  Filled 2015-04-04: qty 2

## 2015-04-04 NOTE — Progress Notes (Signed)
Patient trasfered from ED to [phone] via stretcher; alert and oriented x 4; complaints of generalized pain; IV in RAC  Running NS@100cc /hr ; skin intact. Orient patient to room and unit; watch safety video; gave patient care guide; instructed how to use the call bell and  fall risk precautions. Will continue to monitor the patient.

## 2015-04-04 NOTE — Care Management (Signed)
Transitional Care Clinic Care Coordination Note:  Admit date:  04/04/15 Discharge date: TBD Discharge Disposition: TBD Patient contact: [phone] (mobile) Emergency contact(s): Renel Ende (ph# [phone])  This Case Manager reviewed patient's EMR and determined patient would benefit from post-discharge medical management and chronic care management services through the [facility] and [facility]. Patient has a history of type 2 diabetes mellitus, coronary artery disease, hyperlipidemia, hypertension, COPD, rheumatoid arthritis. Receiving treatment for sepsis of unclear etiology. Patient has had 2 inpatient admissions and 3 ED visits in the last 6 months. This Case Manager met with patient to discuss the services and medical management that can be provided at the [facility]. Patient informed he will receive a post-discharge follow-up phone call from [place] 24-48 hours after discharge. Patient verbalized understanding and agreed to receive post-discharge care at the [facility].   Patient scheduled for Transitional Care appointment on 04/10/15 at 1130 with Dr. Jarold Song.  Clinic information and appointment time provided to patient. Appointment information also placed on AVS. In addition, attempted to reach patient's daughter, Clarene Critchley, to inform of the [facility] and to inform her of appointment time; however, unable to reach her.   Assessment:       Home Environment: Patient lives with his daughter, Clarene Critchley, in a private residence.       Support System: daughter, Clarene Critchley       Level of functioning: Independent though patient indicates he has difficulty walking due to Rheumatoid arthritis       Home DME: none       Home care services: none       Transportation: Patient indicated his daughter, Clarene Critchley, drives him to his medical appointments.        Food/Nutrition: Patient indicated his  daughter does the grocery shopping and prepares meals. He indicated he has access to the food he needs.        Medications: Patient indicated [facility] could not [facility] and denied difficulty obtaining needed medications. He indicated he does not have any medication copays.        Identified Barriers: ? DME needed. Patient indicated he has difficulty walking due to Rheumatoid arthritis.  PT evaluation pending.        PCP: [name], NP at [facility] and [place]              Arranged services:        Services communicated to Carles Collet, RN CM

## 2015-04-04 NOTE — ED Provider Notes (Signed)
CSN: [id number]     Arrival date & time 04/04/15  1017 History   First MD Initiated Contact with Patient 04/04/15 [phone]     Chief Complaint  Patient presents with  . Fall  . Pain     (Consider location/radiation/quality/duration/timing/severity/associated sxs/prior Treatment) HPI  Jorge Malone is a 74yo male, PMH DM, CAD, HLD, HTN, here for diffuse body aches after a fall.  Patient states he was up trying to walk to the bathroom (when he normally does not ambulate on his own) when he fell over.  He denies any prodomal symptoms.  He denies hitting his head or LOC.  He states he has diffuse body pain and aches for days prior to the fall and the fall did not make these aches any worse.  He denies any fevers, coughing, N/V/D or changes in his urination.  Patient has no further complaints.    10 Systems reviewed and are negative for acute change except as noted in the HPI.     Past Medical History  Diagnosis Date  . Diabetes mellitus without complication ([place])   . Rheumatoid arthritis ([place])   . High cholesterol   . Coronary artery disease   . Hyperlipidemia 12/13/2014  . Cancer ([place])   . Hypertension    Past Surgical History  Procedure Laterality Date  . Coronary stent placement    . Brain surgery     History reviewed. No pertinent family history. Social History  Substance Use Topics  . Smoking status: Current Every Day Smoker -- 0.50 packs/day    Types: Cigarettes    Last Attempt to Quit: 12/11/2014  . Smokeless tobacco: None  . Alcohol Use: Yes     Comment: occasional    Review of Systems    Allergies  Amoxicillin  Home Medications   Prior to Admission medications   Medication Sig Start Date End Date Taking? Authorizing Provider  colchicine 0.6 MG tablet Take 0.6 mg by mouth 2 (two) times daily as needed. 01/06/15  Yes Historical Provider, MD  etanercept (ENBREL SURECLICK) 50 MG/ML injection Inject 50 mg into the skin once a week. On Sundays   Yes Historical Provider, MD   fenofibrate (TRICOR) 48 MG tablet Take 48 mg by mouth daily.   Yes Historical Provider, MD  folic acid (FOLVITE) 1 MG tablet Take 1 tablet (1 mg total) by mouth daily. 02/07/15  Yes Lance Bosch, NP  hydrochlorothiazide (HYDRODIURIL) 25 MG tablet Take 1 tablet (25 mg total) by mouth daily. 01/10/15  Yes Lance Bosch, NP  levalbuterol (XOPENEX) 0.63 MG/3ML nebulizer solution Take 3 mLs (0.63 mg total) by nebulization every 6 (six) hours as needed for wheezing or shortness of breath. Use 3 times daily x 4 days, then every 6 hours as needed for SOB. 12/15/14  Yes Eugenie Filler, MD  lisinopril (PRINIVIL,ZESTRIL) 2.5 MG tablet Take 1 tablet (2.5 mg total) by mouth daily. 01/31/15  Yes Lance Bosch, NP  metFORMIN (GLUCOPHAGE) 1000 MG tablet Take 1 tablet (1,000 mg total) by mouth daily with breakfast. Patient taking differently: Take 1,000 mg by mouth 2 (two) times daily with a meal.  12/20/14  Yes Arnoldo Morale, MD  metoprolol tartrate (LOPRESSOR) 25 MG tablet Take 1 tablet (25 mg total) by mouth daily. 01/10/15  Yes Lance Bosch, NP  Multiple Vitamin (MULTIVITAMIN) tablet Take 1 tablet by mouth daily.   Yes Historical Provider, MD  predniSONE (DELTASONE) 20 MG tablet Take 1 tablet (20 mg total) by mouth  daily. 02/03/15  Yes Tanna Furry, MD  rosuvastatin (CRESTOR) 5 MG tablet Take 5 mg by mouth daily.   Yes Historical Provider, MD  tiotropium (SPIRIVA HANDIHALER) 18 MCG inhalation capsule Place 1 capsule (18 mcg total) into inhaler and inhale daily. 12/15/14  Yes Eugenie Filler, MD  Blood Glucose Monitoring Suppl ([place]) W/DEVICE KIT TID and QHS 02/07/15   Lance Bosch, NP  budesonide-formoterol [facility]) 160-4.5 MCG/ACT inhaler Inhale 2 puffs into the lungs 2 (two) times daily. Patient not taking: Reported on 01/10/2015 12/15/14   Eugenie Filler, MD  glucose blood (TRUETEST TEST) test strip Check sugars twice per day for E11.9 01/10/15   Lance Bosch, NP  glucose blood test strip  Use as instructed 02/07/15   Lance Bosch, NP  guaiFENesin (MUCINEX) 600 MG 12 hr tablet Take 2 tablets (1,200 mg total) by mouth 2 (two) times daily. Take for 4 days then stop. Patient not taking: Reported on 04/04/2015 12/15/14   Eugenie Filler, MD  HYDROcodone-acetaminophen (NORCO/VICODIN) 5-325 MG per tablet Take 2 tablets by mouth every 4 (four) hours as needed. Patient not taking: Reported on 04/04/2015 02/03/15   Tanna Furry, MD  lisinopril (ZESTRIL) 2.5 MG tablet Take 1 tablet (2.5 mg total) by mouth daily. Patient not taking: Reported on 04/04/2015 03/04/15   Lance Bosch, NP  traMADol (ULTRAM) 50 MG tablet Take 1 tablet (50 mg total) by mouth every 8 (eight) hours as needed. Patient not taking: Reported on 04/04/2015 02/14/15   Lance Bosch, NP   BP 136/76 mmHg  Pulse 124  Temp(Src) 101.6 F (38.7 C) (Rectal)  Resp 31  Ht _0  (1.753 m)  Wt 230 lb (104.327 kg)  BMI 33.95 kg/m2  SpO2 92% Physical Exam  Constitutional: He is oriented to person, place, and time. Vital signs are normal. He appears well-developed and well-nourished.  Non-toxic appearance. He does not appear ill. No distress.  HENT:  Head: Normocephalic and atraumatic.  Nose: Nose normal.  Mouth/Throat: Oropharynx is clear and moist. No oropharyngeal exudate.  Eyes: Conjunctivae and EOM are normal. Pupils are equal, round, and reactive to light. No scleral icterus.  Neck: Normal range of motion. Neck supple. No tracheal deviation, no edema, no erythema and normal range of motion present. No thyroid mass and no thyromegaly present.  Cardiovascular: Regular rhythm, S1 normal, S2 normal, normal heart sounds, intact distal pulses and normal pulses.  Exam reveals no gallop and no friction rub.   No murmur heard. Pulses:      Radial pulses are 2+ on the right side, and 2+ on the left side.       Dorsalis pedis pulses are 2+ on the right side, and 2+ on the left side.  tachycardic  Pulmonary/Chest: Effort normal and  breath sounds normal. No respiratory distress. He has no wheezes. He has no rhonchi. He has no rales.  Abdominal: Soft. Normal appearance and bowel sounds are normal. He exhibits no distension, no ascites and no mass. There is no hepatosplenomegaly. There is no tenderness. There is no rebound, no guarding and no CVA tenderness.  Musculoskeletal: Normal range of motion. He exhibits no edema or tenderness.  Lymphadenopathy:    He has no cervical adenopathy.  Neurological: He is alert and oriented to person, place, and time. He has normal strength. No cranial nerve deficit or sensory deficit.  Skin: Skin is warm, dry and intact. No petechiae and no rash noted. He is not  diaphoretic. No erythema. No pallor.  Tactile fever  Psychiatric: He has a normal mood and affect. His behavior is normal. Judgment normal.  Nursing note and vitals reviewed.   ED Course  Procedures (including critical care time) Labs Review Labs Reviewed  CBC WITH DIFFERENTIAL/PLATELET - Abnormal; Notable for the following:    WBC 13.8 (*)    Neutro Abs 11.1 (*)    Monocytes Absolute 1.1 (*)    All other components within normal limits  COMPREHENSIVE METABOLIC PANEL - Abnormal; Notable for the following:    Glucose, Bld 147 (*)    Albumin 3.1 (*)    ALT 15 (*)    All other components within normal limits  LIPASE, BLOOD - Abnormal; Notable for the following:    Lipase 57 (*)    All other components within normal limits  I-STAT CG4 LACTIC ACID, ED - Abnormal; Notable for the following:    Lactic Acid, Venous 2.85 (*)    All other components within normal limits  I-STAT CHEM 8, ED - Abnormal; Notable for the following:    Glucose, Bld 146 (*)    Calcium, Ion 1.12 (*)    All other components within normal limits  CULTURE, BLOOD (ROUTINE X 2)  CULTURE, BLOOD (ROUTINE X 2)  URINE CULTURE  URINALYSIS, ROUTINE W REFLEX MICROSCOPIC (NOT AT [facility])  I-STAT CG4 LACTIC ACID, ED    Imaging Review Dg Chest Port 1  View  04/04/2015  CLINICAL DATA:  Code sepsis. Tachycardia. History COPD, diabetes, coronary artery disease and hypertension. EXAM: PORTABLE CHEST 1 VIEW COMPARISON:  Chest radiograph January 02, 2015 FINDINGS: Cardiac silhouette is mildly enlarged unchanged. Mediastinal silhouette is nonsuspicious. Strandy densities in lung bases, no pleural effusion or focal consolidation. Bilateral midlung zone scarring. No pneumothorax. Soft tissue planes and included osseous structure nonsuspicious. IMPRESSION: Stable cardiomegaly.  Bibasilar atelectasis. Electronically Signed   By: Elon Alas M.D.   On: 04/04/2015 05:44   I have personally reviewed and evaluated these images and lab results as part of my medical decision-making.   EKG Interpretation None      MDM   Final diagnoses:  Tachycardia    Patient presents to the ED for diffuse body pain after a fall.  VS reveal concern for sepsis and code sepsis was called.  Patient given broad spectrum abx.  Patient also hypoxic to 88% on RA and he normally does not wear oxygen at home.  CXR negative for pneumonia.  UA is pending.  I spoke with Dr. Blaine Hamper who will admit the patient for further care.   Everlene Balls, MD 04/04/15 [phone]

## 2015-04-04 NOTE — ED Notes (Signed)
Patient transported to X-ray 

## 2015-04-04 NOTE — Care Management Note (Addendum)
Case Management Note  Patient Details  Name: Jorge Malone MRN: [id number] Date of Birth: 12-Mar-1941  Subjective/Objective:    Date:04/04/15 Spoke with patient at the bedside. Introduced self as Tourist information centre manager and explained role in discharge planning and how to be reached.  Verified patient lives in town, with daughter. Expressed potential need for no other DME. Verified patient anticipates to go home with family, at time of discharge and will have part-time supervision by familyat this time to best of their knowledge. Patient denied needing help with their medication. Patient is driven by daughter to MD appointments. Verified patient has PCP [name].  Patient seen by [facility], await pt eval.   Plan: CM will continue to follow for discharge planning and [facility] resources.                 Action/Plan:   Expected Discharge Date:                  Expected Discharge Plan:  Home/Self Care  In-House Referral:     Discharge planning Services  CM Consult  Post Acute Care Choice:    Choice offered to:     DME Arranged:    DME Agency:     HH Arranged:    HH Agency:     Status of Service:  In process, will continue to follow  Medicare Important Message Given:    Date Medicare IM Given:    Medicare IM give by:    Date Additional Medicare IM Given:    Additional Medicare Important Message give by:     If discussed at [address] of Stay Meetings, dates discussed:    Additional Comments:  Zenon Mayo, RN 04/04/2015, 4:30 PM

## 2015-04-04 NOTE — Progress Notes (Signed)
PT Cancellation Note  Patient Details Name: Jorge Malone MRN: [id number] DOB: [birth year]   Cancelled Treatment:    Reason Eval/Treat Not Completed: Patient at procedure or test/unavailable . Pt currently off unit for testing. Will check back as schedule allows to complete PT eval.   Rolinda Roan 04/04/2015, 10:58 AM   Rolinda Roan, PT, DPT Acute Rehabilitation Services Pager: [phone]

## 2015-04-04 NOTE — Progress Notes (Signed)
ANTICOAGULATION CONSULT NOTE - Initial Consult  Pharmacy Consult for Heparin Indication: pulmonary embolus  Allergies  Allergen Reactions  . Amoxicillin Nausea Only and Other (See Comments)    GI problems Pt tolerates cephalosporins    Patient Measurements: Height: 5\' 9"  (175.3 cm) Weight: 225 lb 9.6 oz (102.331 kg) IBW/kg (Calculated) : 70.7 Heparin Dosing Weight: ~92kg  Vital Signs: Temp: 97.7 F (36.5 C) (11/04 1332) Temp Source: Oral (11/04 1332) BP: 130/82 mmHg (11/04 1332) Pulse Rate: 81 (11/04 1332)  Labs:  Recent Labs  04/04/15 0515 04/04/15 0531 04/04/15 0816 04/04/15 1345  HGB 14.1 15.6  --   --   HCT 43.5 46.0  --   --   PLT 205  --   --   --   CREATININE 1.05 1.00  --   --   CKTOTAL  --   --  20* 23*  CKMB  --   --  0.8 1.4  TROPONINI  --   --  0.03 <0.03    Estimated Creatinine Clearance: 76.4 mL/min (by C-G formula based on Cr of 1).   Medical History: Past Medical History  Diagnosis Date  . Diabetes mellitus without complication ([place])   . Rheumatoid arthritis ([place])   . High cholesterol   . Coronary artery disease   . Hyperlipidemia 12/13/2014  . Cancer ([place])   . Hypertension    Assessment: 73yom with pleuritic chest pain x 3-4 days. CT angio shows subacute vs chronic PE. He will begin IV heparin. Received a dose of prophylactic lovenox 50mg  at 0950 this morning so will give a modified bolus. Renal function and CBC wnl.  Goal of Therapy:  Heparin level 0.3-0.7 units/ml Monitor platelets by anticoagulation protocol: Yes   Plan:  1) Heparin bolus 2500 units x 1 2) Heparin drip at 1600 units/hr 3) Check 8 hour heparin level 4) Daily heparin level and CBC  Deboraha Sprang 04/04/2015,5:30 PM

## 2015-04-04 NOTE — ED Notes (Signed)
Pt O2 sat in the 80s, placed on 2L O2; EDP notified.

## 2015-04-04 NOTE — H&P (Addendum)
History and Physical        Hospital Admission Note Date: 04/04/2015  Patient name: Jorge Malone Medical record number: [id number] Date of birth: Sep 25, 1940 Age: 74 y.o. Gender: male  PCP: Lance Bosch, NP  Referring physician: Dr Claudine Mouton  Chief Complaint:  FALL, generalized pain, abdominal pain, pleuritic chest pain , right knee pain for the last 3-4 days  HPI: Patient is a 74 year old male with diabetes mellitus, rheumatoid arthritis, hyperlipidemia, coronary disease, hypertension presented to ED after a fall, diffuse body aches, right knee pain. History was obtained from the patient who is a somewhat poor historian. Patient reported that the reason he came to the ER was that he was up this morning trying to walk to the bathroom, when he fell. He denied any dizziness, syncopal episode or hitting his head or any loss of consciousness. He reported that he was having diffuse body pain and aches for 3-4 days prior to the fall. He also reported right knee pain with swelling. On further cushioning patient reported that he also noticed right upper quadrant abdominal pain for the last 3 days radiating across to the abdomen into the right chest, 8/10 on deep breathing and coughing, dull and achy. He denied any nausea, vomiting, hematochezia or melena. Patient reported that he was having difficulty urinating but no dysuria or hematuria.  ED workup reviewed, CBC showed elevated leukocytosis 13.8, hemoglobin 14.1, hematocrit 43.5, BMET unremarkable, lipase elevated 57, lactic acid 2.85 Chest x-ray showed atelectasis with cardiomegaly. Vital signs temperature 101.6, tachycardia with rate 118, respiratory rate 23, BP 114/83 O2 sats 89%.  Review of Systems:  Constitutional: Denies fever, chills, diaphoresis, + poor appetite and fatigue.  HEENT: Denies photophobia, eye pain, redness, hearing loss,  ear pain, congestion, sore throat, rhinorrhea, sneezing, mouth sores, trouble swallowing, neck pain, neck stiffness and tinnitus.   Respiratory: Denies SOB, DOE, cough, chest tightness,  and wheezing.   Cardiovascular: + Pleuritic chest pain, palpitations and leg swelling.  Gastrointestinal: Denies nausea, vomiting, +abdominal pain, diarrhea, constipation, blood in stool and abdominal distention.  Genitourinary: Denies dysuria, urgency, frequency, hematuria, flank pain and difficulty urinating.  Musculoskeletal: Please see history of present illness Skin: Denies pallor, rash and wound.  Neurological: Denies dizziness, seizures, syncope,+ weakness, light-headedness, numbness and headaches.  Hematological: Denies adenopathy. Easy bruising, personal or family bleeding history  Psychiatric/Behavioral: Denies suicidal ideation, mood changes, confusion, nervousness, sleep disturbance and agitation  Past Medical History: Past Medical History  Diagnosis Date  . Diabetes mellitus without complication ([place])   . Rheumatoid arthritis ([place])   . High cholesterol   . Coronary artery disease   . Hyperlipidemia 12/13/2014  . Cancer ([place])   . Hypertension     Past Surgical History  Procedure Laterality Date  . Coronary stent placement    . Brain surgery      Medications: Prior to Admission medications   Medication Sig Start Date End Date Taking? Authorizing Provider  colchicine 0.6 MG tablet Take 0.6 mg by mouth 2 (two) times daily as needed. 01/06/15  Yes Historical Provider, MD  etanercept (ENBREL SURECLICK) 50 MG/ML injection Inject 50 mg into the skin once a week. On Sundays  Yes Historical Provider, MD  fenofibrate (TRICOR) 48 MG tablet Take 48 mg by mouth daily.   Yes Historical Provider, MD  folic acid (FOLVITE) 1 MG tablet Take 1 tablet (1 mg total) by mouth daily. 02/07/15  Yes Lance Bosch, NP  hydrochlorothiazide (HYDRODIURIL) 25 MG tablet Take 1 tablet (25 mg total) by mouth daily.  01/10/15  Yes Lance Bosch, NP  levalbuterol (XOPENEX) 0.63 MG/3ML nebulizer solution Take 3 mLs (0.63 mg total) by nebulization every 6 (six) hours as needed for wheezing or shortness of breath. Use 3 times daily x 4 days, then every 6 hours as needed for SOB. 12/15/14  Yes Eugenie Filler, MD  lisinopril (PRINIVIL,ZESTRIL) 2.5 MG tablet Take 1 tablet (2.5 mg total) by mouth daily. 01/31/15  Yes Lance Bosch, NP  metFORMIN (GLUCOPHAGE) 1000 MG tablet Take 1 tablet (1,000 mg total) by mouth daily with breakfast. Patient taking differently: Take 1,000 mg by mouth 2 (two) times daily with a meal.  12/20/14  Yes Arnoldo Morale, MD  metoprolol tartrate (LOPRESSOR) 25 MG tablet Take 1 tablet (25 mg total) by mouth daily. 01/10/15  Yes Lance Bosch, NP  Multiple Vitamin (MULTIVITAMIN) tablet Take 1 tablet by mouth daily.   Yes Historical Provider, MD  predniSONE (DELTASONE) 20 MG tablet Take 1 tablet (20 mg total) by mouth daily. 02/03/15  Yes Tanna Furry, MD  rosuvastatin (CRESTOR) 5 MG tablet Take 5 mg by mouth daily.   Yes Historical Provider, MD  tiotropium (SPIRIVA HANDIHALER) 18 MCG inhalation capsule Place 1 capsule (18 mcg total) into inhaler and inhale daily. 12/15/14  Yes Eugenie Filler, MD  Blood Glucose Monitoring Suppl ([place]) W/DEVICE KIT TID and QHS 02/07/15   Lance Bosch, NP  budesonide-formoterol [facility]) 160-4.5 MCG/ACT inhaler Inhale 2 puffs into the lungs 2 (two) times daily. Patient not taking: Reported on 01/10/2015 12/15/14   Eugenie Filler, MD  glucose blood (TRUETEST TEST) test strip Check sugars twice per day for E11.9 01/10/15   Lance Bosch, NP  glucose blood test strip Use as instructed 02/07/15   Lance Bosch, NP  guaiFENesin (MUCINEX) 600 MG 12 hr tablet Take 2 tablets (1,200 mg total) by mouth 2 (two) times daily. Take for 4 days then stop. Patient not taking: Reported on 04/04/2015 12/15/14   Eugenie Filler, MD  HYDROcodone-acetaminophen  (NORCO/VICODIN) 5-325 MG per tablet Take 2 tablets by mouth every 4 (four) hours as needed. Patient not taking: Reported on 04/04/2015 02/03/15   Tanna Furry, MD  lisinopril (ZESTRIL) 2.5 MG tablet Take 1 tablet (2.5 mg total) by mouth daily. Patient not taking: Reported on 04/04/2015 03/04/15   Lance Bosch, NP  traMADol (ULTRAM) 50 MG tablet Take 1 tablet (50 mg total) by mouth every 8 (eight) hours as needed. Patient not taking: Reported on 04/04/2015 02/14/15   Lance Bosch, NP    Allergies:   Allergies  Allergen Reactions  . Amoxicillin Nausea Only and Other (See Comments)    GI problems Pt tolerates cephalosporins    Social History:  Patient reports that he has been smoking Cigarettes.  He has been smoking about 0.50 packs per day. He does not have any smokeless tobacco history on file. He reports that he drinks alcohol. He reports that he does not use illicit drugs.  Family History: Denies any history of premature coronary artery disease, stroke in the family  Physical Exam: Blood pressure 107/62, pulse 125, temperature  101.6 F (38.7 C), temperature source Rectal, resp. rate 18, height 5' 9"  (1.753 m), weight 104.327 kg (230 lb), SpO2 94 %. General: Alert, awake, oriented x3, in no acute distress. HEENT: normocephalic, atraumatic, anicteric sclera, pink conjunctiva, pupils equal and reactive to light and accomodation, oropharynx clear Neck: supple, no masses or lymphadenopathy, no goiter, no bruits  Heart: Regular rate and rhythm, without murmurs, rubs or gallops. Lungs: Clear to auscultation bilaterally, no wheezing, rales or rhonchi. Abdomen: Soft, mild right upper quadrant abdominal TTP , nondistended, positive bowel sounds, no masses. Extremities: No clubbing, cyanosis or edema with positive pedal pulses. + Right knee effusion, mildly tender Neuro: Grossly intact, no focal neurological deficits, strength 5/5 upper and lower extremities bilaterally Psych: alert and oriented x  3, normal mood and affect Skin: no rashes or lesions, warm and dry   LABS on Admission:  Basic Metabolic Panel:  Recent Labs Lab 04/04/15 0515 04/04/15 0531  NA 138 138  K 4.1 3.9  CL 101 102  CO2 23  --   GLUCOSE 147* 146*  BUN 13 15  CREATININE 1.05 1.00  CALCIUM 9.5  --    Liver Function Tests:  Recent Labs Lab 04/04/15 0515  AST 21  ALT 15*  ALKPHOS 55  BILITOT 1.1  PROT 6.9  ALBUMIN 3.1*    Recent Labs Lab 04/04/15 0515  LIPASE 57*   No results for input(s): AMMONIA in the last 168 hours. CBC:  Recent Labs Lab 04/04/15 0515 04/04/15 0531  WBC 13.8*  --   NEUTROABS 11.1*  --   HGB 14.1 15.6  HCT 43.5 46.0  MCV 92.0  --   PLT 205  --    Cardiac Enzymes: No results for input(s): CKTOTAL, CKMB, CKMBINDEX, TROPONINI in the last 168 hours. BNP: Invalid input(s): POCBNP CBG: No results for input(s): GLUCAP in the last 168 hours.  Radiological Exams on Admission:  Dg Chest Port 1 View  04/04/2015  CLINICAL DATA:  Code sepsis. Tachycardia. History COPD, diabetes, coronary artery disease and hypertension. EXAM: PORTABLE CHEST 1 VIEW COMPARISON:  Chest radiograph January 02, 2015 FINDINGS: Cardiac silhouette is mildly enlarged unchanged. Mediastinal silhouette is nonsuspicious. Strandy densities in lung bases, no pleural effusion or focal consolidation. Bilateral midlung zone scarring. No pneumothorax. Soft tissue planes and included osseous structure nonsuspicious. IMPRESSION: Stable cardiomegaly.  Bibasilar atelectasis. Electronically Signed   By: Elon Alas M.D.   On: 04/04/2015 05:44    *I have personally reviewed the images above*  EKG: Independently reviewed. Rate 130, sinus tachycardia  Assessment/Plan Principal Problem:   Sepsis ([place]) of unclear etiology: Patient met SIRS criteria with fever, tachycardia, hypoxia, lactic acidosis, leukocytosis, unclear source, UA pending. Patient is also immunosuppressed due to being on steroids and  eternacept -  Obtain blood cultures, pro-calcitonin, influenza panel, CPK (rule out rhabdomyolysis due to diffuse body aches), ESR, CRP, uric acid to rule out any gout flare. Obtain UA and culture stat. -will also check CT angiogram of the chest to rule out PE, CT abdomen and pelvis (lipase slightly elevated, rule out pancreatitis) - Empirically placed on IV vancomycin and aztreonam   - Continue IV fluids, lactic acidosis could be due to metformin versus sepsis   Active Problems:   Rheumatoid arthritis (HCC) - Continue prednisone, hold eternacept until sepsis ruled out    DM2 (diabetes mellitus, type 2) (HCC) - Placed on sliding scale insulin while inpatient, hold metformin - Obtain hemoglobin A 1C     CAD (coronary artery  disease) - Currently stable, continue beta blocker. Holding HCTZ, lisinopril due to borderline hypotension  - Continue statins however will hold if CPK is elevated     Hyperlipidemia - On statin, TriCor, will hold statins if CPKs are elevated     HTN (hypertension) -Currently borderline BP, continue metoprolol but hold HCTZ and lisinopril      Abdominal pain unclear etiology, has mildly elevated lipase, rule out pancreatitis. Patient also reports pleuritic component with right-sided chest pain and abdominal pain   - Obtain CT injury from of the chest to rule out PE, abdominal CT for further workup     Right knee pain - Obtain ESR, CRP, uric acid, right knee x-ray     Lactic acidosis: Could be due to metformin, sepsis  - Placed on aggressive IV fluid hydration      Fall - PTOT evaluation   Nicotine abuse - Patient counseled strongly on nicotine cessation, placed on nicotine patch  DVT prophylaxis: LOVENOX   CODE STATUS:  full code   Family Communication: Admission, patients condition and plan of care including tests being ordered have been discussed with the patient  who indicates understanding and agree with the plan and Code Status     Time Spent on  Admission  23 minutes  RAI,RIPUDEEP M.D. [facility] 04/04/2015, 7:32 AM Pager: [phone]  If 7PM-7AM, please contact night-coverage [email or web address] Password [email or web address]  Addendum CT angiogram of the chest IMPRESSION: 1. Findings consistent with subacute or chronic pulmonary emboli involving the right upper lobe pulmonary artery, right middle lobe pulmonary artery and branches of the right lower lobe pulmonary artery.  - Placed on IV heparin drip per pharmacy  CT abdomen and pelvis: Showed normal gallbladder, no biliary ductal dilatation pancreas normal. 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 - Please consult urology in a.m.   RAI,RIPUDEEP M.D. [facility] 04/04/2015, 5:25 PM  Pager: [phone]

## 2015-04-04 NOTE — Progress Notes (Signed)
ANTIBIOTIC CONSULT NOTE - INITIAL  Pharmacy Consult for Vancomycin, Aztreonam, Levaquin Indication: Sepsis   Allergies  Allergen Reactions  . Amoxicillin Nausea Only and Other (See Comments)    GI problems Pt tolerates cephalosporins    Patient Measurements: Height: 5\' 9"  (175.3 cm) Weight: 230 lb (104.327 kg) IBW/kg (Calculated) : 70.7  Vital Signs: Temp: 101.6 F (38.7 C) (11/04 0500) Temp Source: Rectal (11/04 0500) BP: 136/76 mmHg (11/04 0545) Pulse Rate: 124 (11/04 0545) Intake/Output from previous day:   Intake/Output from this shift:    Labs:  Recent Labs  04/04/15 0515 04/04/15 0531  WBC 13.8*  --   HGB 14.1 15.6  PLT 205  --   CREATININE  --  1.00   Estimated Creatinine Clearance: 77.1 mL/min (by C-G formula based on Cr of 1). No results for input(s): VANCOTROUGH, VANCOPEAK, VANCORANDOM, GENTTROUGH, GENTPEAK, GENTRANDOM, TOBRATROUGH, TOBRAPEAK, TOBRARND, AMIKACINPEAK, AMIKACINTROU, AMIKACIN in the last 72 hours.   Microbiology: Recent Results (from the past 720 hour(s))  Culture, blood (routine x 2)     Status: None (Preliminary result)   Collection Time: 04/04/15  5:15 AM  Result Value Ref Range Status   Specimen Description BLOOD LEFT ANTECUBITAL  Final   Special Requests BOTTLES DRAWN AEROBIC AND ANAEROBIC 5CC  Final   Culture PENDING  Incomplete   Report Status PENDING  Incomplete  Culture, blood (routine x 2)     Status: None (Preliminary result)   Collection Time: 04/04/15  5:25 AM  Result Value Ref Range Status   Specimen Description BLOOD RIGHT ANTECUBITAL  Final   Special Requests BOTTLES DRAWN AEROBIC AND ANAEROBIC 5CC  Final   Culture PENDING  Incomplete   Report Status PENDING  Incomplete    Medical History: Past Medical History  Diagnosis Date  . Diabetes mellitus without complication ([place])   . Rheumatoid arthritis ([place])   . High cholesterol   . Coronary artery disease   . Hyperlipidemia 12/13/2014  . Cancer ([place])   .  Hypertension     Medications:   (Not in a hospital admission) Assessment: 27 YOM with diffuse body aches. In the ED, he was found to have an elevated WBC of 13.8 and Tm of 101.51F. LA 2.85. Pharmacy consulted to start empiric antibiotics for sepsis. nCrCl ~ 66 mL/min   11/4: BCx2>>  11/4: UCx>>   Goal of Therapy:  Vancomycin trough level 15-20 mcg/ml  Plan:  -Vancomycin 2 gm IV once followed by Vancomycin 750 mg IV Q 12 hours -Aztreonam 1 gm IV Q 8 hours -Levaquin 750 mg IV Q 24 hours  -Monitor CBC, renal fx, cultures and clinical progress -VT at [place], PharmD., BCPS Clinical Pharmacist Pager [phone]

## 2015-04-04 NOTE — Evaluation (Addendum)
Physical Therapy Evaluation Patient Details Name: Jorge Malone MRN: [id number] DOB: November 27, 1940 Today's Date: 04/04/2015   History of Present Illness  Patient is a 74 y/o male presents s/p fall, body aches and right knee pain admitted with sepsis. CXR showed atelectasis with cardiomegaly. Knee xray-Soft tissue swelling and right knee joint effusion.PMH includes RA, DM, COPD, HTN, HLD, Ca.  Clinical Impression  Patient presents with generalized weakness, balance deficits and pain in right knee impacting mobility. Pt with multiple falls at home. Requires Min A with bed mobility and transfers at this time. Recommend use of RW for safety. Fall risk. Would benefit from ST SNF to maximize independence and mobility prior to return home. Eager to return to PLOF.     Follow Up Recommendations SNF    Equipment Recommendations  Other (comment) (defer to next venue)    Recommendations for Other Services OT consult     Precautions / Restrictions Precautions Precautions: Fall Restrictions Weight Bearing Restrictions: No      Mobility  Bed Mobility Overal bed mobility: Needs Assistance Bed Mobility: Rolling;Sidelying to Sit Rolling: Min guard Sidelying to sit: Mod assist;HOB elevated       General bed mobility comments: Mod A to elevate trunk to seated position.   Transfers Overall transfer level: Needs assistance Equipment used: Rolling walker (2 wheeled) Transfers: Sit to/from Stand Sit to Stand: Min assist;From elevated surface         General transfer comment: Multiple attempts to stand without assist per pt request however unable. Min A to boost from EOB with cues for forward momentum and hand placement.  Transferred to chair post ambulation bout.  Ambulation/Gait Ambulation/Gait assistance: Min assist Ambulation Distance (Feet): 20 Feet Assistive device: Rolling walker (2 wheeled) Gait Pattern/deviations: Step-through pattern;Decreased stride length;Wide base of support    Gait velocity interpretation: <1.8 ft/sec, indicative of risk for recurrent falls General Gait Details: Unsteady gait with assist for RW management and balance.   Stairs            Wheelchair Mobility    Modified Rankin (Stroke Patients Only)       Balance Overall balance assessment: History of Falls;Needs assistance Sitting-balance support: Feet supported;Single extremity supported Sitting balance-Leahy Scale: Fair     Standing balance support: During functional activity Standing balance-Leahy Scale: Poor Standing balance comment: Relient on RW for support.                              Pertinent Vitals/Pain Pain Assessment: Faces Faces Pain Scale: Hurts little more Pain Location: right knee with movement Pain Descriptors / Indicators: Sore Pain Intervention(s): Monitored during session;Repositioned    Home Living Family/patient expects to be discharged to:: Skilled nursing facility Living Arrangements: Children Available Help at Discharge: Family Type of Home: House Home Access: Level entry     Home Layout: One level Home Equipment: None      Prior Function Level of Independence: Independent         Comments: Reports needing help with donning socks. Reports 3 falls recently.      Hand Dominance   Dominant Hand: Right    Extremity/Trunk Assessment   Upper Extremity Assessment: Defer to OT evaluation           Lower Extremity Assessment: Generalized weakness;RLE deficits/detail RLE Deficits / Details: Swelling noted.       Communication   Communication: No difficulties  Cognition Arousal/Alertness: Awake/alert Behavior During Therapy: WFL for  tasks assessed/performed Overall Cognitive Status: Within Functional Limits for tasks assessed                      General Comments      Exercises        Assessment/Plan    PT Assessment Patient needs continued PT services  PT Diagnosis Difficulty walking;Acute  pain;Generalized weakness   PT Problem List Decreased strength;Pain;Decreased activity tolerance;Decreased balance;Decreased mobility  PT Treatment Interventions Balance training;Gait training;Functional mobility training;Therapeutic activities;Therapeutic exercise;Patient/family education;DME instruction   PT Goals (Current goals can be found in the Care Plan section) Acute Rehab PT Goals Patient Stated Goal: to go to rehab to get stronger PT Goal Formulation: With patient Time For Goal Achievement: 04/18/15 Potential to Achieve Goals: Fair    Frequency Min 2X/week   Barriers to discharge        Co-evaluation               End of Session Equipment Utilized During Treatment: Gait belt;Oxygen Activity Tolerance: Patient tolerated treatment well Patient left: in chair;with call bell/phone within reach Nurse Communication: Mobility status         Time: 4656-8127 PT Time Calculation (min) (ACUTE ONLY): 25 min   Charges:   PT Evaluation $Initial PT Evaluation Tier I: 1 Procedure PT Treatments $Gait Training: 8-22 mins   PT G Codes:        Liliane Mallis A Amarys Sliwinski 04/04/2015, 4:45 PM Wray Kearns, [place], DPT [phone]

## 2015-04-04 NOTE — ED Notes (Signed)
Pt aware of urine sample needed. Pt will attempt to give one.

## 2015-04-04 NOTE — ED Notes (Signed)
Per EMS, pt from home, suffered a fall. Denies LOC. Pt has hx of arthritis with generalized pain. Pain has progressively worsened in the last few days. CBG- 169, BP-170/92. P-100.

## 2015-04-05 ENCOUNTER — Encounter (HOSPITAL_COMMUNITY): Payer: Self-pay | Admitting: *Deleted

## 2015-04-05 DIAGNOSIS — E119 Type 2 diabetes mellitus without complications: Secondary | ICD-10-CM

## 2015-04-05 DIAGNOSIS — Z720 Tobacco use: Secondary | ICD-10-CM | POA: Diagnosis present

## 2015-04-05 DIAGNOSIS — N3289 Other specified disorders of bladder: Secondary | ICD-10-CM | POA: Diagnosis present

## 2015-04-05 DIAGNOSIS — W19XXXD Unspecified fall, subsequent encounter: Secondary | ICD-10-CM

## 2015-04-05 DIAGNOSIS — A419 Sepsis, unspecified organism: Principal | ICD-10-CM

## 2015-04-05 DIAGNOSIS — M25469 Effusion, unspecified knee: Secondary | ICD-10-CM | POA: Diagnosis present

## 2015-04-05 DIAGNOSIS — I10 Essential (primary) hypertension: Secondary | ICD-10-CM

## 2015-04-05 DIAGNOSIS — M069 Rheumatoid arthritis, unspecified: Secondary | ICD-10-CM

## 2015-04-05 DIAGNOSIS — I2699 Other pulmonary embolism without acute cor pulmonale: Secondary | ICD-10-CM | POA: Diagnosis present

## 2015-04-05 DIAGNOSIS — R1011 Right upper quadrant pain: Secondary | ICD-10-CM

## 2015-04-05 DIAGNOSIS — M25461 Effusion, right knee: Secondary | ICD-10-CM

## 2015-04-05 DIAGNOSIS — E785 Hyperlipidemia, unspecified: Secondary | ICD-10-CM

## 2015-04-05 LAB — SYNOVIAL CELL COUNT + DIFF, W/ CRYSTALS
Crystals, Fluid: NONE SEEN
Lymphocytes-Synovial Fld: 12 % (ref 0–20)
Monocyte-Macrophage-Synovial Fluid: 35 % — ABNORMAL LOW (ref 50–90)
Neutrophil, Synovial: 53 % — ABNORMAL HIGH (ref 0–25)
WBC, SYNOVIAL: 1080 /mm3 — AB (ref 0–200)

## 2015-04-05 LAB — CBC
HEMATOCRIT: 34.3 % — AB (ref 39.0–52.0)
HEMOGLOBIN: 10.9 g/dL — AB (ref 13.0–17.0)
MCH: 29.1 pg (ref 26.0–34.0)
MCHC: 31.8 g/dL (ref 30.0–36.0)
MCV: 91.7 fL (ref 78.0–100.0)
Platelets: 159 10*3/uL (ref 150–400)
RBC: 3.74 MIL/uL — ABNORMAL LOW (ref 4.22–5.81)
RDW: 15.5 % (ref 11.5–15.5)
WBC: 9.3 10*3/uL (ref 4.0–10.5)

## 2015-04-05 LAB — URINE CULTURE: Culture: NO GROWTH

## 2015-04-05 LAB — BASIC METABOLIC PANEL
ANION GAP: 7 (ref 5–15)
BUN: 8 mg/dL (ref 6–20)
CHLORIDE: 108 mmol/L (ref 101–111)
CO2: 23 mmol/L (ref 22–32)
Calcium: 8.4 mg/dL — ABNORMAL LOW (ref 8.9–10.3)
Creatinine, Ser: 0.72 mg/dL (ref 0.61–1.24)
GFR calc Af Amer: >60 mL/min (ref >60)
GLUCOSE: 108 mg/dL — AB (ref 65–99)
POTASSIUM: 3.8 mmol/L (ref 3.5–5.1)
Sodium: 138 mmol/L (ref 135–145)

## 2015-04-05 LAB — HEPARIN LEVEL (UNFRACTIONATED)
HEPARIN UNFRACTIONATED: 0.64 [IU]/mL (ref 0.30–0.70)
Heparin Unfractionated: 1.08 IU/mL — ABNORMAL HIGH (ref 0.30–0.70)
Heparin Unfractionated: 0.69 IU/mL (ref 0.30–0.70)

## 2015-04-05 LAB — GRAM STAIN: Special Requests: NORMAL

## 2015-04-05 LAB — GLUCOSE, CAPILLARY
GLUCOSE-CAPILLARY: 103 mg/dL — AB (ref 65–99)
GLUCOSE-CAPILLARY: 92 mg/dL (ref 65–99)
GLUCOSE-CAPILLARY: 169 mg/dL — AB (ref 65–99)
Glucose-Capillary: 164 mg/dL — ABNORMAL HIGH (ref 65–99)

## 2015-04-05 LAB — HEMOGLOBIN A1C
HEMOGLOBIN A1C: 7.3 % — AB (ref 4.8–5.6)
MEAN PLASMA GLUCOSE: 163 mg/dL

## 2015-04-05 MED ORDER — ENSURE ENLIVE PO LIQD
237.0000 mL | ORAL | Status: DC
  Administered 2015-04-05 – 2015-04-07 (×3): 237 mL via ORAL

## 2015-04-05 MED ORDER — HEPARIN (PORCINE) IN NACL 100-0.45 UNIT/ML-% IJ SOLN
1250.0000 [IU]/h | INTRAMUSCULAR | Status: AC
  Administered 2015-04-05: 1300 [IU]/h via INTRAVENOUS
  Administered 2015-04-06 – 2015-04-08 (×3): 1250 [IU]/h via INTRAVENOUS
  Filled 2015-04-05 (×4): qty 250

## 2015-04-05 NOTE — Progress Notes (Signed)
Initial Nutrition Assessment  DOCUMENTATION CODES:  Obesity unspecified  INTERVENTION:  Ensure Enlive po q 24 hours, each supplement provides 350 kcal and 20 grams of protein  NUTRITION DIAGNOSIS:  Increased nutrient needs related to acute illness as evidenced by estimated nutrition requirements for the condition.   GOAL:  Patient will meet greater than or equal to 90% of their needs  MONITOR:  PO intake, Supplement acceptance, Labs  REASON FOR ASSESSMENT:  Malnutrition Screening Tool    ASSESSMENT:  74 year old male PMHx DM, RA, HLD, HTN, CAD presented to ED after a fall, diffuse body aches, right knee pain. He appeared septic on admission and was started on broad spectrum antibiotics.  Pt is slightly confused. He first stated that he has had no problems eating and his normal weight was 240 lbs. However after I told pt he now was weighed at 225, he started to believe that he has been eating less after all. He states he eats 2 meals a day at home. He did not take any supplements.   His usual weight appears to be mid-low 230s. He may have dropped a slight amount, likely related to acute illness.   He was agreeable to protein supplements.   Diet Order:  Diet heart healthy/carb modified Room service appropriate?: Yes; Fluid consistency:: Thin  Skin:  Reviewed, no issues  Last BM:  11/2  Height:  Ht Readings from Last 1 Encounters:  04/04/15 5\' 9"  (1.753 m)   Weight:  Wt Readings from Last 1 Encounters:  04/04/15 225 lb 9.6 oz (102.331 kg)   Wt Readings from Last 10 Encounters:  04/04/15 225 lb 9.6 oz (102.331 kg)  02/14/15 226 lb (102.513 kg)  01/10/15 231 lb 3.2 oz (104.872 kg)  01/03/15 236 lb (107.049 kg)  12/20/14 236 lb (107.049 kg)  12/14/14 232 lb 11.2 oz (105.552 kg)   Ideal Body Weight:  72.73 kg  BMI:  Body mass index is 33.3 kg/(m^2).  Estimated Nutritional Needs:  Kcal:  1650-1850 (16-18 kcal/kg) Protein:  73-87 g (1-1.2 g/kg BW) Fluid:  1.7-1.9  liters fluid  EDUCATION NEEDS:  No education needs identified at this time  Burtis Junes RD, LDN Nutrition Pager: [phone] 04/05/2015 3:30 PM

## 2015-04-05 NOTE — Progress Notes (Addendum)
PROGRESS NOTE  Jorge Malone IWP:[id number] DOB: Jun 08, 1940 DOA: 04/04/2015 PCP: Lance Bosch, NP  HPI: 74 year old male with diabetes mellitus, rheumatoid arthritis, hyperlipidemia, coronary disease, hypertension presented to ED after a fall, diffuse body aches, right knee pain. He appeared septic on admission and was started on broad spectrum antibiotics.   Subjective / 24 H Interval events - feels better today but endorses low energy. Poor historian.  Assessment/Plan: Principal Problem:   Sepsis ([place]) Active Problems:   Rheumatoid arthritis (HCC)   DM2 (diabetes mellitus, type 2) (HCC)   CAD (coronary artery disease)   Hyperlipidemia   HTN (hypertension)   Abdominal pain   Right knee pain   Lactic acidosis   Fall   Acute pulmonary embolism (HCC)   Knee effusion   Mass of urinary bladder   Tobacco abuse   Sepsis ([place]) of unclear etiology - Patient met SIRS criteria with fever, tachycardia, hypoxia, lactic acidosis, leukocytosis - lactic acid normalized - influenza negative, blood cultures negative to date, urinalysis without evidence of UTI - continue empiric antibiotics  Subacute vs chronic PE  - on right upper lobe pulmonary artery, right middle lobe pulmonary artery and branches of the right lower lobe pulmonary Artery. - heparin per pharmacy, likely NOAC when close to discharge  Urinary bladder mass - 4.0 x 6.3 x 5.6 cm mass involving the right posterolateral wall of the urinary bladder at its base. - patient with long standing history of smoking - urology consulted, discussed with Dr. [name] today   Right knee effusion - patient endorses a history of gout however denies knee swelling or aspirations in the past - given septic appearance, I discussed with Dr. Erlinda Hong from ortho for a knee aspiration. Appreciate help.   CAD (coronary artery disease) - Currently stable, continue beta blocker. Holding HCTZ, lisinopril due to borderline hypotension  - Continue  statins   Chronic diastolic heart failure - euvolemic on exam, received IVF in the setting of sepsis and elevated lactic acid - discontinue fluids today, sepsis physiology resolved  Rheumatoid arthritis (HCC) - Continue prednisone, hold eternacept until sepsis ruled out  DM2 (diabetes mellitus, type 2) ([place]), uncontrolled - Placed on sliding scale insulin while inpatient, hold metformin - hemoglobin A1C 7.3  Hyperlipidemia - On statin, [facility]  HTN (hypertension) - Currently BP normal, continue metoprolol but hold HCTZ and lisinopril   Pleuritic right sided chest pain and right sided abdominal pain - LFTs normal, lipase minimally elevated, suspect pain related to PE  COPD - no wheezing, no apparent exacerbation - continue Spiriva, Albuterol prn - extensive mucous plugging on CT, flutter valve  Fall - PTOT evaluation   Tobacco abuse - counseled for cessation   Diet: Diet NPO time specified Fluids: none DVT Prophylaxis: heparin gtt  Code Status: Full Code Family Communication: daughter  Disposition Plan: remain inpatient  Barriers to discharge: see above  Consultants:  Urology   Orthopedic surgery   Procedures:  None    Antibiotics Vancomycin 11/4 >> Aztreonam 11/4 >> Levofloxacin 11/4 >>    Studies  Ct Angio Chest Pe W/cm &/or Wo Cm  04/04/2015  CLINICAL DATA:  74 year old who fell at home while walking to the bathroom after getting out of bed earlier today. 3-4 day history of right pleuritic chest pain, cough, and right upper quadrant abdominal pain. Laboratory data demonstrates leukocytosis with a [place] blood count of [address] and and elevated lipase of 57. EXAM: CT ANGIOGRAPHY CHEST CT ABDOMEN AND PELVIS WITH CONTRAST TECHNIQUE:  Multidetector CT imaging of the chest was performed using the standard protocol during bolus administration of intravenous contrast. Multiplanar CT image reconstructions and MIPs were obtained to evaluate the vascular anatomy.  Multidetector CT imaging of the abdomen and pelvis was performed using the standard protocol during bolus administration of intravenous contrast. CONTRAST:  173m OMNIPAQUE IOHEXOL 350 MG/ML IV. COMPARISON:  CTA chest abdomen and pelvis 01/03/2015. FINDINGS: CTA CHEST FINDINGS Contrast opacification of pulmonary arteries is good. Respiratory motion blurs many of the images throughout the chest. Overall, the study is of moderate to good diagnostic quality. Filling defects within branches of the right upper lobe pulmonary artery, though the defects are peripheral and along the vessel wall. Similar peripheral filling defects in the right middle lobe pulmonary artery and a lateral segmental branch of the right lower lobe pulmonary artery. No definite filling defects in the branches of the left pulmonary artery. RV/LV ratio = 1.12. Heart enlarged with evidence of left ventricular hypertrophy. Prominent epicardial fat. Moderate LAD coronary atherosclerosis. No pericardial effusion. Mild atherosclerosis involving the thoracic aorta without aneurysm or dissection. Mild peripheral interstitial fibrosis involving the right middle lobe and both lower lobes, unchanged. Atelectasis in the lower lobes. Small bilateral pleural effusions. No confluent airspace consolidation. Scattered small bilateral pleural plaques, noncalcified, unchanged. Numerous normal-sized and upper normal sized mediastinal lymph nodes, the largest a right lower mediastinal (station 8) nodal mass measuring approximately 2.5 x 2.8 cm (previously 2.0 x 2.6 cm. AP window (station 5) lymph nodes have also increased in size since the prior examination. Station 4R and 4L lymph nodes have also increased in size. An enlarged right hilar lymph node measures approximately 2.6 x 2.2 cm, unchanged. Left hilar lymph nodes are normal in size but have increased since the prior examination. No axillary lymphadenopathy. Extensive mucous plugging within the trachea, both  mainstem bronchi, with extension into the right upper lobe and left lower lobe bronchi. 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 Generalized osseous demineralization. Review of the MIP images confirms the above findings. CT ABDOMEN and PELVIS FINDINGS Liver normal in size and appearance. Gallbladder normal in appearance without calcified gallstones. No biliary ductal dilation. Pancreas normal in appearance without evidence of mass, ductal dilation, or inflammation. Spleen normal in size and appearance. Adrenal glands normal in appearance. Bilateral renal sinus lipomatosis. Cortical cysts involving both kidneys. Nonobstructing small bilateral renal calculi. No hydronephrosis. No solid renal masses. Approximate 4.0 x 6.3 x 5.6 cm mass involving the right posterolateral wall of the urinary bladder at its base. Stomach normal in appearance for the degree of distention. Normal-appearing small bowel. Distal descending and sigmoid colon diverticulosis without evidence of acute diverticulitis. Remainder of the colon unremarkable with moderate stool burden. Normal-appearing appendix without evidence of inflammation. No ascites. Severe aortoiliofemoral atherosclerosis without aneurysm. Visceral arteries patent though atherosclerotic. No pathologic lymphadenopathy. Moderate prostate gland enlargement. Normal-appearing seminal vesicles. Penile prosthesis with the inflation device in the right anterior low pelvis adjacent to the urinary bladder. Bone window images demonstrate severe degenerative changes in the left hip, moderate degenerative changes in the right hip, ankylosis of the sacroiliac joints, and multilevel degenerative disc disease, spondylosis and facet degenerative changes throughout the lumbar spine. IMPRESSION: 1. Findings consistent with subacute or chronic pulmonary emboli involving the right upper lobe pulmonary artery, right middle lobe pulmonary artery and branches of the  right lower lobe pulmonary artery. 2. Atelectasis involving the lower lobes. Small bilateral pleural effusions. 3. Enlarging mediastinal lymph nodes. Index nodes  are measured above. 4. Extensive mucous plugging involving the trachea, bilateral mainstem bronchi, with extension into the right upper lobe and left lower lobe bronchi. 5. Large mass arising from the right posterolateral wall of the urinary bladder at its base. Measurements given above. 6. Distal descending and sigmoid colon diverticulosis without evidence of acute diverticulitis. 7. Osseous findings as above.  No acute osseous abnormalities. Electronically Signed   By: Evangeline Dakin M.D.   On: 04/04/2015 12:16   Ct Abdomen Pelvis W Contrast  04/04/2015  CLINICAL DATA:  74 year old who fell at home while walking to the bathroom after getting out of bed earlier today. 3-4 day history of right pleuritic chest pain, cough, and right upper quadrant abdominal pain. Laboratory data demonstrates leukocytosis with a [place] blood count of 557[address] and and elevated lipase of 57. EXAM: CT ANGIOGRAPHY CHEST CT ABDOMEN AND PELVIS WITH CONTRAST TECHNIQUE: Multidetector CT imaging of the chest was performed using the standard protocol during bolus administration of intravenous contrast. Multiplanar CT image reconstructions and MIPs were obtained to evaluate the vascular anatomy. Multidetector CT imaging of the abdomen and pelvis was performed using the standard protocol during bolus administration of intravenous contrast. CONTRAST:  131m OMNIPAQUE IOHEXOL 350 MG/ML IV. COMPARISON:  CTA chest abdomen and pelvis 01/03/2015. FINDINGS: CTA CHEST FINDINGS Contrast opacification of pulmonary arteries is good. Respiratory motion blurs many of the images throughout the chest. Overall, the study is of moderate to good diagnostic quality. Filling defects within branches of the right upper lobe pulmonary artery, though the defects are peripheral and along the vessel wall.  Similar peripheral filling defects in the right middle lobe pulmonary artery and a lateral segmental branch of the right lower lobe pulmonary artery. No definite filling defects in the branches of the left pulmonary artery. RV/LV ratio = 1.12. Heart enlarged with evidence of left ventricular hypertrophy. Prominent epicardial fat. Moderate LAD coronary atherosclerosis. No pericardial effusion. Mild atherosclerosis involving the thoracic aorta without aneurysm or dissection. Mild peripheral interstitial fibrosis involving the right middle lobe and both lower lobes, unchanged. Atelectasis in the lower lobes. Small bilateral pleural effusions. No confluent airspace consolidation. Scattered small bilateral pleural plaques, noncalcified, unchanged. Numerous normal-sized and upper normal sized mediastinal lymph nodes, the largest a right lower mediastinal (station 8) nodal mass measuring approximately 2.5 x 2.8 cm (previously 2.0 x 2.6 cm. AP window (station 5) lymph nodes have also increased in size since the prior examination. Station 4R and 4L lymph nodes have also increased in size. An enlarged right hilar lymph node measures approximately 2.6 x 2.2 cm, unchanged. Left hilar lymph nodes are normal in size but have increased since the prior examination. No axillary lymphadenopathy. Extensive mucous plugging within the trachea, both mainstem bronchi, with extension into the right upper lobe and left lower lobe bronchi. 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 Generalized osseous demineralization. Review of the MIP images confirms the above findings. CT ABDOMEN and PELVIS FINDINGS Liver normal in size and appearance. Gallbladder normal in appearance without calcified gallstones. No biliary ductal dilation. Pancreas normal in appearance without evidence of mass, ductal dilation, or inflammation. Spleen normal in size and appearance. Adrenal glands normal in appearance. Bilateral renal  sinus lipomatosis. Cortical cysts involving both kidneys. Nonobstructing small bilateral renal calculi. No hydronephrosis. No solid renal masses. Approximate 4.0 x 6.3 x 5.6 cm mass involving the right posterolateral wall of the urinary bladder at its base. Stomach normal in appearance for the degree of distention. Normal-appearing  small bowel. Distal descending and sigmoid colon diverticulosis without evidence of acute diverticulitis. Remainder of the colon unremarkable with moderate stool burden. Normal-appearing appendix without evidence of inflammation. No ascites. Severe aortoiliofemoral atherosclerosis without aneurysm. Visceral arteries patent though atherosclerotic. No pathologic lymphadenopathy. Moderate prostate gland enlargement. Normal-appearing seminal vesicles. Penile prosthesis with the inflation device in the right anterior low pelvis adjacent to the urinary bladder. Bone window images demonstrate severe degenerative changes in the left hip, moderate degenerative changes in the right hip, ankylosis of the sacroiliac joints, and multilevel degenerative disc disease, spondylosis and facet degenerative changes throughout the lumbar spine. IMPRESSION: 1. Findings consistent with subacute or chronic pulmonary emboli involving the right upper lobe pulmonary artery, right middle lobe pulmonary artery and branches of the right lower lobe pulmonary artery. 2. Atelectasis involving the lower lobes. Small bilateral pleural effusions. 3. Enlarging mediastinal lymph nodes. Index nodes are measured above. 4. Extensive mucous plugging involving the trachea, bilateral mainstem bronchi, with extension into the right upper lobe and left lower lobe bronchi. 5. Large mass arising from the right posterolateral wall of the urinary bladder at its base. Measurements given above. 6. Distal descending and sigmoid colon diverticulosis without evidence of acute diverticulitis. 7. Osseous findings as above.  No acute osseous  abnormalities. Electronically Signed   By: Evangeline Dakin M.D.   On: 04/04/2015 12:16   Dg Chest Port 1 View  04/04/2015  CLINICAL DATA:  Code sepsis. Tachycardia. History COPD, diabetes, coronary artery disease and hypertension. EXAM: PORTABLE CHEST 1 VIEW COMPARISON:  Chest radiograph January 02, 2015 FINDINGS: Cardiac silhouette is mildly enlarged unchanged. Mediastinal silhouette is nonsuspicious. Strandy densities in lung bases, no pleural effusion or focal consolidation. Bilateral midlung zone scarring. No pneumothorax. Soft tissue planes and included osseous structure nonsuspicious. IMPRESSION: Stable cardiomegaly.  Bibasilar atelectasis. Electronically Signed   By: Elon Alas M.D.   On: 04/04/2015 05:44   Dg Knee Complete 4 Views Right  04/04/2015  CLINICAL DATA:  Acute anterior right knee pain for 4 weeks with swelling. EXAM: RIGHT KNEE - COMPLETE 4+ VIEW COMPARISON:  None. FINDINGS: Diffuse soft tissue swelling/subcutaneous edema evident. Normal alignment without acute osseous finding or fracture. Moderately large joint effusion noted on the lateral view. IMPRESSION: Soft tissue swelling and right knee joint effusion. No acute osseous finding. Electronically Signed   By: Jerilynn Mages.  Shick M.D.   On: 04/04/2015 08:14    Objective  Filed Vitals:   04/04/15 1332 04/04/15 2135 04/05/15 0523 04/05/15 0934  BP: 130/82 111/66 109/83   Pulse: 81 66 65   Temp: 97.7 F (36.5 C) 98 F (36.7 C) 97.9 F (36.6 C)   TempSrc: Oral Oral Oral   Resp: _0 Height:      Weight:      SpO2: 97% 98% 98% 98%    Intake/Output Summary (Last 24 hours) at 04/05/15 1105 Last data filed at 04/05/15 0835  Gross per 24 hour  Intake 2921.66 ml  Output   3175 ml  Net -253.34 ml   Filed Weights   04/04/15 0509 04/04/15 0848  Weight: 104.327 kg (230 lb) 102.331 kg (225 lb 9.6 oz)    Exam:  GENERAL: NAD  HEENT: head NCAT, no scleral icterus. Pupils round and reactive.   NECK: Supple.  LUNGS:  Clear to auscultation. No wheezing or crackles. Distant breath sounds  HEART: Regular rate and rhythm without murmur. 2+ pulses, no JVD, trace peripheral edema  ABDOMEN: Soft, obese. Mild tender on right  upper quadrant. Positive bowel sounds.  EXTREMITIES: Without any cyanosis or clubbing. Good muscle tone. Right knee swollen, no erythema / warmth. Tender to palpation  NEUROLOGIC: Alert and oriented x3. Cranial nerves II through XII are grossly intact. Strength 5/5 in all 4.  SKIN: No ulceration, induration, rashes present.  Data Reviewed: Basic Metabolic Panel:  Recent Labs Lab 04/04/15 0515 04/04/15 0531 04/05/15 0123  NA 138 138 138  K 4.1 3.9 3.8  CL 101 102 108  CO2 23  --  23  GLUCOSE 147* 146* 108*  BUN _0 CREATININE 1.05 1.00 0.72  CALCIUM 9.5  --  8.4*   Liver Function Tests:  Recent Labs Lab 04/04/15 0515  AST 21  ALT 15*  ALKPHOS 55  BILITOT 1.1  PROT 6.9  ALBUMIN 3.1*    Recent Labs Lab 04/04/15 0515  LIPASE 57*   CBC:  Recent Labs Lab 04/04/15 0515 04/04/15 0531 04/05/15 0123  WBC 13.8*  --  9.3  NEUTROABS 11.1*  --   --   HGB 14.1 15.6 10.9*  HCT 43.5 46.0 34.3*  MCV 92.0  --  91.7  PLT 205  --  159   Cardiac Enzymes:  Recent Labs Lab 04/04/15 0816 04/04/15 1345 04/04/15 1930  CKTOTAL 20* 23* 26*  CKMB 0.8 1.4 1.4  TROPONINI 0.03 <0.03 <0.03   CBG:  Recent Labs Lab 04/04/15 0853 04/04/15 1224 04/04/15 1653 04/04/15 2158 04/05/15 0740  GLUCAP 115* 137* 143* 120* 103*    Recent Results (from the past 240 hour(s))  Culture, blood (routine x 2)     Status: None (Preliminary result)   Collection Time: 04/04/15  5:15 AM  Result Value Ref Range Status   Specimen Description BLOOD LEFT ANTECUBITAL  Final   Special Requests BOTTLES DRAWN AEROBIC AND ANAEROBIC 5CC  Final   Culture NO GROWTH 1 DAY  Final   Report Status PENDING  Incomplete  Culture, blood (routine x 2)     Status: None (Preliminary result)    Collection Time: 04/04/15  5:25 AM  Result Value Ref Range Status   Specimen Description BLOOD RIGHT ANTECUBITAL  Final   Special Requests BOTTLES DRAWN AEROBIC AND ANAEROBIC 5CC  Final   Culture NO GROWTH 1 DAY  Final   Report Status PENDING  Incomplete     Scheduled Meds: . aztreonam  1 g Intravenous Q8H  . colchicine  0.6 mg Oral Daily  . fenofibrate  54 mg Oral Daily  . folic acid  1 mg Oral Daily  . insulin aspart  0-5 Units Subcutaneous QHS  . insulin aspart  0-9 Units Subcutaneous TID WC  . levofloxacin (LEVAQUIN) IV  750 mg Intravenous Q24H  . metoprolol tartrate  25 mg Oral Daily  . predniSONE  20 mg Oral Q breakfast  . rosuvastatin  5 mg Oral Daily  . sodium chloride  3 mL Intravenous Q12H  . tiotropium  18 mcg Inhalation Daily  . vancomycin  750 mg Intravenous Q12H   Continuous Infusions: . sodium chloride 100 mL/hr at 04/05/15 0447  . sodium chloride    . heparin 1,300 Units/hr (04/05/15 1018)   Time spent: 45 minutes, more than 50% bedside and on the floor coordinating care, discussing with family members and answering multiple questions   Marzetta Board, MD [facility] Pager [phone]. If 7 PM - 7 AM, please contact night-coverage at [email or web address], password [facility] 04/05/2015, 11:05 AM  LOS: 1 day

## 2015-04-05 NOTE — Progress Notes (Signed)
ANTICOAGULATION CONSULT NOTE - Follow-up Consult  Pharmacy Consult for Heparin Indication: pulmonary embolus  Allergies  Allergen Reactions  . Amoxicillin Nausea Only and Other (See Comments)    GI problems Pt tolerates cephalosporins    Patient Measurements: Height: 5\' 9"  (175.3 cm) Weight: 225 lb 9.6 oz (102.331 kg) IBW/kg (Calculated) : 70.7 Heparin Dosing Weight: ~92kg  Vital Signs: Temp: 98 F (36.7 C) (11/04 2135) Temp Source: Oral (11/04 2135) BP: 111/66 mmHg (11/04 2135) Pulse Rate: 66 (11/04 2135)  Labs:  Recent Labs  04/04/15 0515 04/04/15 0531 04/04/15 0816 04/04/15 1345 04/04/15 1930 04/05/15 0123  HGB 14.1 15.6  --   --   --  10.9*  HCT 43.5 46.0  --   --   --  34.3*  PLT 205  --   --   --   --  159  HEPARINUNFRC  --   --   --   --   --  1.08*  CREATININE 1.05 1.00  --   --   --  0.72  CKTOTAL  --   --  20* 23* 26*  --   CKMB  --   --  0.8 1.4 1.4  --   TROPONINI  --   --  0.03 <0.03 <0.03  --     Estimated Creatinine Clearance: 95.4 mL/min (by C-G formula based on Cr of 0.72).   Assessment: 73yom on heparin for subacute vs chronic PE. Heparin level supratherapeutic (1.08) on 1600 units/hr. Wonder if Lovenox dose from 1000 might still be hanging around. Hgb down to 10.9, plt down to 159. No issues with line or bleeding reported per RN.  Goal of Therapy:  Heparin level 0.3-0.7 units/ml Monitor platelets by anticoagulation protocol: Yes   Plan:  Hold heparin for 1 hour Decrease heparin drip to 1300 units/hr Check 8 hour heparin level  Sherlon Handing, PharmD, BCPS Clinical pharmacist, pager [phone] 04/05/2015,2:06 AM

## 2015-04-05 NOTE — Consult Note (Signed)
ORTHOPAEDIC CONSULTATION  REQUESTING PHYSICIAN: Caren Griffins, MD  Chief Complaint: right knee pain and effusion  HPI: Jorge Malone is a 74 y.o. male who presents with right knee pain and swelling for last 3-4 days.  Patient is poor historian.  Also presents with body aches and fever.  Endorses moderate pain with weight bearing but no pain with ROM.  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  Pain is worse with weight bearing.  Pain does not radiate.  Pain is achy.  Ortho consulted to r/o septic knee.  Xray showed effusion.  He was febrile and tachycardic in ER.  Past Medical History  Diagnosis Date  . Diabetes mellitus without complication ([place])   . Rheumatoid arthritis ([place])   . High cholesterol   . Coronary artery disease   . Hyperlipidemia 12/13/2014  . Cancer ([place])   . Hypertension    Past Surgical History  Procedure Laterality Date  . Coronary stent placement    . Brain surgery     Social History   Social History  . Marital Status: Single    Spouse Name: N/A  . Number of Children: N/A  . Years of Education: N/A   Social History Main Topics  . Smoking status: Current Every Day Smoker -- 0.50 packs/day    Types: Cigarettes    Last Attempt to Quit: 12/11/2014  . Smokeless tobacco: None  . Alcohol Use: Yes     Comment: occasional  . Drug Use: No  . Sexual Activity: Not Asked   Other Topics Concern  . None   Social History Narrative   History reviewed. No pertinent family history. Allergies  Allergen Reactions  . Amoxicillin Nausea Only and Other (See Comments)    GI problems Pt tolerates cephalosporins   Prior to Admission medications   Medication Sig Start Date End Date Taking? Authorizing Provider  colchicine 0.6 MG tablet Take 0.6 mg by mouth 2 (two) times daily as needed. 01/06/15  Yes Historical Provider, MD  etanercept (ENBREL SURECLICK) 50 MG/ML injection Inject 50 mg into the skin once a week. On Sundays   Yes Historical Provider, MD    fenofibrate (TRICOR) 48 MG tablet Take 48 mg by mouth daily.   Yes Historical Provider, MD  folic acid (FOLVITE) 1 MG tablet Take 1 tablet (1 mg total) by mouth daily. 02/07/15  Yes Lance Bosch, NP  hydrochlorothiazide (HYDRODIURIL) 25 MG tablet Take 1 tablet (25 mg total) by mouth daily. 01/10/15  Yes Lance Bosch, NP  levalbuterol (XOPENEX) 0.63 MG/3ML nebulizer solution Take 3 mLs (0.63 mg total) by nebulization every 6 (six) hours as needed for wheezing or shortness of breath. Use 3 times daily x 4 days, then every 6 hours as needed for SOB. 12/15/14  Yes Eugenie Filler, MD  lisinopril (PRINIVIL,ZESTRIL) 2.5 MG tablet Take 1 tablet (2.5 mg total) by mouth daily. 01/31/15  Yes Lance Bosch, NP  metFORMIN (GLUCOPHAGE) 1000 MG tablet Take 1 tablet (1,000 mg total) by mouth daily with breakfast. Patient taking differently: Take 1,000 mg by mouth 2 (two) times daily with a meal.  12/20/14  Yes Arnoldo Morale, MD  metoprolol tartrate (LOPRESSOR) 25 MG tablet Take 1 tablet (25 mg total) by mouth daily. 01/10/15  Yes Lance Bosch, NP  Multiple Vitamin (MULTIVITAMIN) tablet Take 1 tablet by mouth daily.   Yes Historical Provider, MD  predniSONE (DELTASONE) 20 MG tablet Take 1 tablet (20 mg total) by mouth daily. 02/03/15  Yes Tanna Furry, MD  rosuvastatin (CRESTOR) 5 MG tablet Take 5 mg by mouth daily.   Yes Historical Provider, MD  tiotropium (SPIRIVA HANDIHALER) 18 MCG inhalation capsule Place 1 capsule (18 mcg total) into inhaler and inhale daily. 12/15/14  Yes Eugenie Filler, MD  Blood Glucose Monitoring Suppl ([place]) W/DEVICE KIT TID and QHS 02/07/15   Lance Bosch, NP  budesonide-formoterol [facility]) 160-4.5 MCG/ACT inhaler Inhale 2 puffs into the lungs 2 (two) times daily. Patient not taking: Reported on 01/10/2015 12/15/14   Eugenie Filler, MD  glucose blood (TRUETEST TEST) test strip Check sugars twice per day for E11.9 01/10/15   Lance Bosch, NP  glucose blood test strip  Use as instructed 02/07/15   Lance Bosch, NP  guaiFENesin (MUCINEX) 600 MG 12 hr tablet Take 2 tablets (1,200 mg total) by mouth 2 (two) times daily. Take for 4 days then stop. Patient not taking: Reported on 04/04/2015 12/15/14   Eugenie Filler, MD  HYDROcodone-acetaminophen (NORCO/VICODIN) 5-325 MG per tablet Take 2 tablets by mouth every 4 (four) hours as needed. Patient not taking: Reported on 04/04/2015 02/03/15   Tanna Furry, MD  lisinopril (ZESTRIL) 2.5 MG tablet Take 1 tablet (2.5 mg total) by mouth daily. Patient not taking: Reported on 04/04/2015 03/04/15   Lance Bosch, NP  traMADol (ULTRAM) 50 MG tablet Take 1 tablet (50 mg total) by mouth every 8 (eight) hours as needed. Patient not taking: Reported on 04/04/2015 02/14/15   Lance Bosch, NP   Ct Angio Chest Pe W/cm &/or Wo Cm  04/04/2015  CLINICAL DATA:  74 year old who fell at home while walking to the bathroom after getting out of bed earlier today. 3-4 day history of right pleuritic chest pain, cough, and right upper quadrant abdominal pain. Laboratory data demonstrates leukocytosis with a [place] blood [place] of [address] and and elevated lipase of 57. EXAM: CT ANGIOGRAPHY CHEST CT ABDOMEN AND PELVIS WITH CONTRAST TECHNIQUE: Multidetector CT imaging of the chest was performed using the standard protocol during bolus administration of intravenous contrast. Multiplanar CT image reconstructions and MIPs were obtained to evaluate the vascular anatomy. Multidetector CT imaging of the abdomen and pelvis was performed using the standard protocol during bolus administration of intravenous contrast. CONTRAST:  17m OMNIPAQUE IOHEXOL 350 MG/ML IV. COMPARISON:  CTA chest abdomen and pelvis 01/03/2015. FINDINGS: CTA CHEST FINDINGS Contrast opacification of pulmonary arteries is good. Respiratory motion blurs many of the images throughout the chest. Overall, the study is of moderate to good diagnostic quality. Filling defects within branches of the right  upper lobe pulmonary artery, though the defects are peripheral and along the vessel wall. Similar peripheral filling defects in the right middle lobe pulmonary artery and a lateral segmental branch of the right lower lobe pulmonary artery. No definite filling defects in the branches of the left pulmonary artery. RV/LV ratio = 1.12. Heart enlarged with evidence of left ventricular hypertrophy. Prominent epicardial fat. Moderate LAD coronary atherosclerosis. No pericardial effusion. Mild atherosclerosis involving the thoracic aorta without aneurysm or dissection. Mild peripheral interstitial fibrosis involving the right middle lobe and both lower lobes, unchanged. Atelectasis in the lower lobes. Small bilateral pleural effusions. No confluent airspace consolidation. Scattered small bilateral pleural plaques, noncalcified, unchanged. Numerous normal-sized and upper normal sized mediastinal lymph nodes, the largest a right lower mediastinal (station 8) nodal mass measuring approximately 2.5 x 2.8 cm (previously 2.0 x 2.6 cm. AP window (station 5) lymph nodes have also  increased in size since the prior examination. Station 4R and 4L lymph nodes have also increased in size. An enlarged right hilar lymph node measures approximately 2.6 x 2.2 cm, unchanged. Left hilar lymph nodes are normal in size but have increased since the prior examination. No axillary lymphadenopathy. Extensive mucous plugging within the trachea, both mainstem bronchi, with extension into the right upper lobe and left lower lobe bronchi. 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 Generalized osseous demineralization. Review of the MIP images confirms the above findings. CT ABDOMEN and PELVIS FINDINGS Liver normal in size and appearance. Gallbladder normal in appearance without calcified gallstones. No biliary ductal dilation. Pancreas normal in appearance without evidence of mass, ductal dilation, or inflammation.  Spleen normal in size and appearance. Adrenal glands normal in appearance. Bilateral renal sinus lipomatosis. Cortical cysts involving both kidneys. Nonobstructing small bilateral renal calculi. No hydronephrosis. No solid renal masses. Approximate 4.0 x 6.3 x 5.6 cm mass involving the right posterolateral wall of the urinary bladder at its base. Stomach normal in appearance for the degree of distention. Normal-appearing small bowel. Distal descending and sigmoid colon diverticulosis without evidence of acute diverticulitis. Remainder of the colon unremarkable with moderate stool burden. Normal-appearing appendix without evidence of inflammation. No ascites. Severe aortoiliofemoral atherosclerosis without aneurysm. Visceral arteries patent though atherosclerotic. No pathologic lymphadenopathy. Moderate prostate gland enlargement. Normal-appearing seminal vesicles. Penile prosthesis with the inflation device in the right anterior low pelvis adjacent to the urinary bladder. Bone window images demonstrate severe degenerative changes in the left hip, moderate degenerative changes in the right hip, ankylosis of the sacroiliac joints, and multilevel degenerative disc disease, spondylosis and facet degenerative changes throughout the lumbar spine. IMPRESSION: 1. Findings consistent with subacute or chronic pulmonary emboli involving the right upper lobe pulmonary artery, right middle lobe pulmonary artery and branches of the right lower lobe pulmonary artery. 2. Atelectasis involving the lower lobes. Small bilateral pleural effusions. 3. Enlarging mediastinal lymph nodes. Index nodes are measured above. 4. Extensive mucous plugging involving the trachea, bilateral mainstem bronchi, with extension into the right upper lobe and left lower lobe bronchi. 5. Large mass arising from the right posterolateral wall of the urinary bladder at its base. Measurements given above. 6. Distal descending and sigmoid colon diverticulosis  without evidence of acute diverticulitis. 7. Osseous findings as above.  No acute osseous abnormalities. Electronically Signed   By: Evangeline Dakin M.D.   On: 04/04/2015 12:16   Ct Abdomen Pelvis W Contrast  04/04/2015  CLINICAL DATA:  74 year old who fell at home while walking to the bathroom after getting out of bed earlier today. 3-4 day history of right pleuritic chest pain, cough, and right upper quadrant abdominal pain. Laboratory data demonstrates leukocytosis with a [place] blood [place] of [address] and and elevated lipase of 57. EXAM: CT ANGIOGRAPHY CHEST CT ABDOMEN AND PELVIS WITH CONTRAST TECHNIQUE: Multidetector CT imaging of the chest was performed using the standard protocol during bolus administration of intravenous contrast. Multiplanar CT image reconstructions and MIPs were obtained to evaluate the vascular anatomy. Multidetector CT imaging of the abdomen and pelvis was performed using the standard protocol during bolus administration of intravenous contrast. CONTRAST:  189m OMNIPAQUE IOHEXOL 350 MG/ML IV. COMPARISON:  CTA chest abdomen and pelvis 01/03/2015. FINDINGS: CTA CHEST FINDINGS Contrast opacification of pulmonary arteries is good. Respiratory motion blurs many of the images throughout the chest. Overall, the study is of moderate to good diagnostic quality. Filling defects within branches of the right upper  lobe pulmonary artery, though the defects are peripheral and along the vessel wall. Similar peripheral filling defects in the right middle lobe pulmonary artery and a lateral segmental branch of the right lower lobe pulmonary artery. No definite filling defects in the branches of the left pulmonary artery. RV/LV ratio = 1.12. Heart enlarged with evidence of left ventricular hypertrophy. Prominent epicardial fat. Moderate LAD coronary atherosclerosis. No pericardial effusion. Mild atherosclerosis involving the thoracic aorta without aneurysm or dissection. Mild peripheral interstitial  fibrosis involving the right middle lobe and both lower lobes, unchanged. Atelectasis in the lower lobes. Small bilateral pleural effusions. No confluent airspace consolidation. Scattered small bilateral pleural plaques, noncalcified, unchanged. Numerous normal-sized and upper normal sized mediastinal lymph nodes, the largest a right lower mediastinal (station 8) nodal mass measuring approximately 2.5 x 2.8 cm (previously 2.0 x 2.6 cm. AP window (station 5) lymph nodes have also increased in size since the prior examination. Station 4R and 4L lymph nodes have also increased in size. An enlarged right hilar lymph node measures approximately 2.6 x 2.2 cm, unchanged. Left hilar lymph nodes are normal in size but have increased since the prior examination. No axillary lymphadenopathy. Extensive mucous plugging within the trachea, both mainstem bronchi, with extension into the right upper lobe and left lower lobe bronchi. 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 Generalized osseous demineralization. Review of the MIP images confirms the above findings. CT ABDOMEN and PELVIS FINDINGS Liver normal in size and appearance. Gallbladder normal in appearance without calcified gallstones. No biliary ductal dilation. Pancreas normal in appearance without evidence of mass, ductal dilation, or inflammation. Spleen normal in size and appearance. Adrenal glands normal in appearance. Bilateral renal sinus lipomatosis. Cortical cysts involving both kidneys. Nonobstructing small bilateral renal calculi. No hydronephrosis. No solid renal masses. Approximate 4.0 x 6.3 x 5.6 cm mass involving the right posterolateral wall of the urinary bladder at its base. Stomach normal in appearance for the degree of distention. Normal-appearing small bowel. Distal descending and sigmoid colon diverticulosis without evidence of acute diverticulitis. Remainder of the colon unremarkable with moderate stool burden.  Normal-appearing appendix without evidence of inflammation. No ascites. Severe aortoiliofemoral atherosclerosis without aneurysm. Visceral arteries patent though atherosclerotic. No pathologic lymphadenopathy. Moderate prostate gland enlargement. Normal-appearing seminal vesicles. Penile prosthesis with the inflation device in the right anterior low pelvis adjacent to the urinary bladder. Bone window images demonstrate severe degenerative changes in the left hip, moderate degenerative changes in the right hip, ankylosis of the sacroiliac joints, and multilevel degenerative disc disease, spondylosis and facet degenerative changes throughout the lumbar spine. IMPRESSION: 1. Findings consistent with subacute or chronic pulmonary emboli involving the right upper lobe pulmonary artery, right middle lobe pulmonary artery and branches of the right lower lobe pulmonary artery. 2. Atelectasis involving the lower lobes. Small bilateral pleural effusions. 3. Enlarging mediastinal lymph nodes. Index nodes are measured above. 4. Extensive mucous plugging involving the trachea, bilateral mainstem bronchi, with extension into the right upper lobe and left lower lobe bronchi. 5. Large mass arising from the right posterolateral wall of the urinary bladder at its base. Measurements given above. 6. Distal descending and sigmoid colon diverticulosis without evidence of acute diverticulitis. 7. Osseous findings as above.  No acute osseous abnormalities. Electronically Signed   By: Evangeline Dakin M.D.   On: 04/04/2015 12:16   Dg Chest Port 1 View  04/04/2015  CLINICAL DATA:  Code sepsis. Tachycardia. History COPD, diabetes, coronary artery disease and hypertension. EXAM: PORTABLE CHEST  1 VIEW COMPARISON:  Chest radiograph January 02, 2015 FINDINGS: Cardiac silhouette is mildly enlarged unchanged. Mediastinal silhouette is nonsuspicious. Strandy densities in lung bases, no pleural effusion or focal consolidation. Bilateral midlung zone  scarring. No pneumothorax. Soft tissue planes and included osseous structure nonsuspicious. IMPRESSION: Stable cardiomegaly.  Bibasilar atelectasis. Electronically Signed   By: Elon Alas M.D.   On: 04/04/2015 05:44   Dg Knee Complete 4 Views Right  04/04/2015  CLINICAL DATA:  Acute anterior right knee pain for 4 weeks with swelling. EXAM: RIGHT KNEE - COMPLETE 4+ VIEW COMPARISON:  None. FINDINGS: Diffuse soft tissue swelling/subcutaneous edema evident. Normal alignment without acute osseous finding or fracture. Moderately large joint effusion noted on the lateral view. IMPRESSION: Soft tissue swelling and right knee joint effusion. No acute osseous finding. Electronically Signed   By: Jerilynn Mages.  Shick M.D.   On: 04/04/2015 08:14    Positive ROS: All other systems have been reviewed and were otherwise negative with the exception of those mentioned in the HPI and as above.  Physical Exam: General: Alert, no acute distress Cardiovascular: No pedal edema Respiratory: No cyanosis, no use of accessory musculature GI: No organomegaly, abdomen is soft and non-tender Skin: No lesions in the area of chief complaint Neurologic: Sensation intact distally Psychiatric: Patient is competent for consent with normal mood and affect Lymphatic: No axillary or cervical lymphadenopathy  MUSCULOSKELETAL:  - small effusion of right knee - no warmth, cellulitis, erythema - very mild discomfort with full ROM of knee  Assessment: Right knee pain and effusion  Plan: - knee aspirated at bedside with return of 10 cc of murky yellowish fluid - grossly appears to be gout fluid - will await cell count and diff - gram stain and cultures sent also  Thank you for the consult and the opportunity to see Jorge Malone  N. Eduard Roux, MD [place] 10:19 AM

## 2015-04-05 NOTE — Consult Note (Signed)
Consult: Bladder mass Requested by: Dr. Cruzita Lederer  Chief Complaint: bladder mass  History of Present Illness: 74 year old admitted with some right pleuritic chest pain. He had fever, tachycardia and leukocytosis in the ED. He was found to have pulmonary embolism on CT scan but also CT revealed a 6 x 4 cm right posterior bladder mass. It appeared superficial. The bladder wall and surrounding fat appeared normal. There is no hydronephrosis. The upper tracts appeared normal. He had a three-piece penile prosthesis. I reviewed all the images. His UA was clear. BUN and creatinine normal.  Patient is a long-time smoker. He moved down the summer to be near his daughter. He had a 3 piece IPP placed about 3-4 years ago which is working normally. He denies trouble voiding. He has a Foley catheter and now on the urine is clear.  Past Medical History  Diagnosis Date  . Diabetes mellitus without complication ([place])   . Rheumatoid arthritis ([place])   . High cholesterol   . Coronary artery disease   . Hyperlipidemia 12/13/2014  . Cancer ([place])   . Hypertension    Past Surgical History  Procedure Laterality Date  . Coronary stent placement    . Brain surgery      Home Medications:  Prescriptions prior to admission  Medication Sig Dispense Refill Last Dose  . colchicine 0.6 MG tablet Take 0.6 mg by mouth 2 (two) times daily as needed.  1 Past Month at Unknown time  . etanercept (ENBREL SURECLICK) 50 MG/ML injection Inject 50 mg into the skin once a week. On Sundays   Past Week at Unknown time  . fenofibrate (TRICOR) 48 MG tablet Take 48 mg by mouth daily.   04/03/2015 at Unknown time  . folic acid (FOLVITE) 1 MG tablet Take 1 tablet (1 mg total) by mouth daily. 30 tablet 2 04/03/2015 at Unknown time  . hydrochlorothiazide (HYDRODIURIL) 25 MG tablet Take 1 tablet (25 mg total) by mouth daily. 30 tablet 3 04/03/2015 at Unknown time  . levalbuterol (XOPENEX) 0.63 MG/3ML nebulizer solution Take 3 mLs (0.63 mg  total) by nebulization every 6 (six) hours as needed for wheezing or shortness of breath. Use 3 times daily x 4 days, then every 6 hours as needed for SOB. 360 mL 3 04/03/2015 at Unknown time  . lisinopril (PRINIVIL,ZESTRIL) 2.5 MG tablet Take 1 tablet (2.5 mg total) by mouth daily. 30 tablet 5 04/03/2015 at Unknown time  . metFORMIN (GLUCOPHAGE) 1000 MG tablet Take 1 tablet (1,000 mg total) by mouth daily with breakfast. (Patient taking differently: Take 1,000 mg by mouth 2 (two) times daily with a meal. ) 60 tablet 2 04/03/2015 at Unknown time  . metoprolol tartrate (LOPRESSOR) 25 MG tablet Take 1 tablet (25 mg total) by mouth daily. 30 tablet 3 04/03/2015 at 0800  . Multiple Vitamin (MULTIVITAMIN) tablet Take 1 tablet by mouth daily.   04/03/2015 at Unknown time  . predniSONE (DELTASONE) 20 MG tablet Take 1 tablet (20 mg total) by mouth daily. 10 tablet 0 04/03/2015 at Unknown time  . rosuvastatin (CRESTOR) 5 MG tablet Take 5 mg by mouth daily.   04/03/2015 at Unknown time  . tiotropium (SPIRIVA HANDIHALER) 18 MCG inhalation capsule Place 1 capsule (18 mcg total) into inhaler and inhale daily. 30 capsule 2 04/03/2015 at Unknown time  . Blood Glucose Monitoring Suppl (ONE TOUCH BASIC SYSTEM) W/DEVICE KIT TID and QHS 1 each 0   . budesonide-formoterol (SYMBICORT) 160-4.5 MCG/ACT inhaler Inhale 2 puffs into the lungs  2 (two) times daily. (Patient not taking: Reported on 01/10/2015) 1 Inhaler 0 Not Taking at Unknown time  . glucose blood (TRUETEST TEST) test strip Check sugars twice per day for E11.9 100 each 12   . glucose blood test strip Use as instructed 100 each 12   . guaiFENesin (MUCINEX) 600 MG 12 hr tablet Take 2 tablets (1,200 mg total) by mouth 2 (two) times daily. Take for 4 days then stop. (Patient not taking: Reported on 04/04/2015) 16 tablet 0 Completed Course at Unknown time  . HYDROcodone-acetaminophen (NORCO/VICODIN) 5-325 MG per tablet Take 2 tablets by mouth every 4 (four) hours as needed.  (Patient not taking: Reported on 04/04/2015) 10 tablet 0 Not Taking at Unknown time  . lisinopril (ZESTRIL) 2.5 MG tablet Take 1 tablet (2.5 mg total) by mouth daily. (Patient not taking: Reported on 04/04/2015) 30 tablet 2 Not Taking at Unknown time  . traMADol (ULTRAM) 50 MG tablet Take 1 tablet (50 mg total) by mouth every 8 (eight) hours as needed. (Patient not taking: Reported on 04/04/2015) 60 tablet 1 Not Taking at Unknown time   Allergies:  Allergies  Allergen Reactions  . Amoxicillin Nausea Only and Other (See Comments)    GI problems Pt tolerates cephalosporins    History reviewed. No pertinent family history. Social History:  reports that he has been smoking Cigarettes.  He has been smoking about 0.50 packs per day. He does not have any smokeless tobacco history on file. He reports that he drinks alcohol. He reports that he does not use illicit drugs.  ROS: A complete review of systems was performed.  All systems are negative except for pertinent findings as noted. ROS   Physical Exam:  Vital signs in last 24 hours: Temp:  [97.7 F (36.5 C)-98 F (36.7 C)] 97.9 F (36.6 C) (11/05 0523) Pulse Rate:  [65-81] 65 (11/05 0523) Resp:  [18] 18 (11/05 0523) BP: (109-130)/(66-83) 109/83 mmHg (11/05 0523) SpO2:  [97 %-98 %] 98 % (11/05 0934) General:  Alert and oriented, No acute distress HEENT: Normocephalic, atraumatic Neck: No JVD or lymphadenopathy Cardiovascular: Regular rate and rhythm Lungs: Regular rate and effort Abdomen: Soft, nontender, nondistended, no abdominal masses Back: No CVA tenderness Extremities: No edema Neurologic: Grossly intact  Laboratory Data:  Results for orders placed or performed during the hospital encounter of 04/04/15 (from the past 24 hour(s))  Glucose, capillary     Status: Abnormal   Collection Time: 04/04/15 12:24 PM  Result Value Ref Range   Glucose-Capillary 137 (H) 65 - 99 mg/dL  Urinalysis, Routine w reflex microscopic (not at [facility])      Status: Abnormal   Collection Time: 04/04/15 12:29 PM  Result Value Ref Range   Color, Urine YELLOW YELLOW   APPearance CLEAR CLEAR   Specific Gravity, Urine 1.046 (H) 1.005 - 1.030   pH 5.0 5.0 - 8.0   Glucose, UA NEGATIVE NEGATIVE mg/dL   Hgb urine dipstick NEGATIVE NEGATIVE   Bilirubin Urine NEGATIVE NEGATIVE   Ketones, ur NEGATIVE NEGATIVE mg/dL   Protein, ur NEGATIVE NEGATIVE mg/dL   Urobilinogen, UA 0.2 0.0 - 1.0 mg/dL   Nitrite NEGATIVE NEGATIVE   Leukocytes, UA NEGATIVE NEGATIVE  CK total and CKMB (cardiac)not at [facility]     Status: Abnormal   Collection Time: 04/04/15  1:45 PM  Result Value Ref Range   Total CK 23 (L) 49 - 397 U/L   CK, MB 1.4 0.5 - 5.0 ng/mL   Relative Index RELATIVE INDEX  IS INVALID 0.0 - 2.5  Troponin I     Status: None   Collection Time: 04/04/15  1:45 PM  Result Value Ref Range   Troponin I <0.03 <0.031 ng/mL  Hemoglobin A1c     Status: Abnormal   Collection Time: 04/04/15  1:45 PM  Result Value Ref Range   Hgb A1c MFr Bld 7.3 (H) 4.8 - 5.6 %   Mean Plasma Glucose 163 mg/dL  Glucose, capillary     Status: Abnormal   Collection Time: 04/04/15  4:53 PM  Result Value Ref Range   Glucose-Capillary 143 (H) 65 - 99 mg/dL  CK total and CKMB (cardiac)not at [facility]     Status: Abnormal   Collection Time: 04/04/15  7:30 PM  Result Value Ref Range   Total CK 26 (L) 49 - 397 U/L   CK, MB 1.4 0.5 - 5.0 ng/mL   Relative Index RELATIVE INDEX IS INVALID 0.0 - 2.5  Troponin I     Status: None   Collection Time: 04/04/15  7:30 PM  Result Value Ref Range   Troponin I <0.03 <0.031 ng/mL  Glucose, capillary     Status: Abnormal   Collection Time: 04/04/15  9:58 PM  Result Value Ref Range   Glucose-Capillary 120 (H) 65 - 99 mg/dL  Basic metabolic panel     Status: Abnormal   Collection Time: 04/05/15  1:23 AM  Result Value Ref Range   Sodium 138 135 - 145 mmol/L   Potassium 3.8 3.5 - 5.1 mmol/L   Chloride 108 101 - 111 mmol/L   CO2 23 22 - 32 mmol/L    Glucose, Bld 108 (H) 65 - 99 mg/dL   BUN 8 6 - 20 mg/dL   Creatinine, Ser 0.72 0.61 - 1.24 mg/dL   Calcium 8.4 (L) 8.9 - 10.3 mg/dL   GFR calc non Af Amer >60 >60 mL/min   GFR calc Af Amer >60 >60 mL/min   Anion gap 7 5 - 15  CBC     Status: Abnormal   Collection Time: 04/05/15  1:23 AM  Result Value Ref Range   WBC 9.3 4.0 - 10.5 K/uL   RBC 3.74 (L) 4.22 - 5.81 MIL/uL   Hemoglobin 10.9 (L) 13.0 - 17.0 g/dL   HCT 34.3 (L) 39.0 - 52.0 %   MCV 91.7 78.0 - 100.0 fL   MCH 29.1 26.0 - 34.0 pg   MCHC 31.8 30.0 - 36.0 g/dL   RDW 15.5 11.5 - 15.5 %   Platelets 159 150 - 400 K/uL  Heparin level (unfractionated)     Status: Abnormal   Collection Time: 04/05/15  1:23 AM  Result Value Ref Range   Heparin Unfractionated 1.08 (H) 0.30 - 0.70 IU/mL  Glucose, capillary     Status: Abnormal   Collection Time: 04/05/15  7:40 AM  Result Value Ref Range   Glucose-Capillary 103 (H) 65 - 99 mg/dL   Recent Results (from the past 240 hour(s))  Culture, blood (routine x 2)     Status: None (Preliminary result)   Collection Time: 04/04/15  5:15 AM  Result Value Ref Range Status   Specimen Description BLOOD LEFT ANTECUBITAL  Final   Special Requests BOTTLES DRAWN AEROBIC AND ANAEROBIC 5CC  Final   Culture NO GROWTH 1 DAY  Final   Report Status PENDING  Incomplete  Culture, blood (routine x 2)     Status: None (Preliminary result)   Collection Time: 04/04/15  5:25 AM  Result  Value Ref Range Status   Specimen Description BLOOD RIGHT ANTECUBITAL  Final   Special Requests BOTTLES DRAWN AEROBIC AND ANAEROBIC 5CC  Final   Culture NO GROWTH 1 DAY  Final   Report Status PENDING  Incomplete   Creatinine:  Recent Labs  04/04/15 0515 04/04/15 0531 04/05/15 0123  CREATININE 1.05 1.00 0.72    Impression/Assessment/plan:  Bladder mass-I discussed with the patient the nature of bladder masses-benign versus malignant and the importance of follow-up for outpatient cystoscopy. When he is more stable he  will likely need a TURBT with interruption of his anticoagulation. He will need medical follow-up established for surgical clearance in the near future. May D/C Foley from a GU point of view and urine output monitoring is no longer needed.  I gave patient follow-up information, also placed on chart and emphasized importance of follow-up. Please call me with any questions, concerns or changes inpatient status.  Jorge Malone 04/05/2015, 10:53 AM

## 2015-04-05 NOTE — Progress Notes (Signed)
ANTICOAGULATION CONSULT NOTE - Follow-up Consult  Pharmacy Consult for Heparin Indication: pulmonary embolus  Allergies  Allergen Reactions  . Amoxicillin Nausea Only and Other (See Comments)    GI problems Pt tolerates cephalosporins    Patient Measurements: Height: 5\' 9"  (175.3 cm) Weight: 225 lb 9.6 oz (102.331 kg) IBW/kg (Calculated) : 70.7 Heparin Dosing Weight: ~92kg  Vital Signs: Temp: 98.4 F (36.9 C) (11/05 1334) Temp Source: Oral (11/05 1334) BP: 131/77 mmHg (11/05 1334) Pulse Rate: 68 (11/05 1334)  Labs:  Recent Labs  04/04/15 0515 04/04/15 0531 04/04/15 0816 04/04/15 1345 04/04/15 1930 04/05/15 0123 04/05/15 1123  HGB 14.1 15.6  --   --   --  10.9*  --   HCT 43.5 46.0  --   --   --  34.3*  --   PLT 205  --   --   --   --  159  --   HEPARINUNFRC  --   --   --   --   --  1.08* 0.64  CREATININE 1.05 1.00  --   --   --  0.72  --   CKTOTAL  --   --  20* 23* 26*  --   --   CKMB  --   --  0.8 1.4 1.4  --   --   TROPONINI  --   --  0.03 <0.03 <0.03  --   --     Estimated Creatinine Clearance: 95.4 mL/min (by C-G formula based on Cr of 0.72).   Assessment: 73yom on heparin for subacute vs chronic PE. Heparin level (0.64) on 1300 units/hr. Noted lovenox 50 mg on 10/4 @ 0950. likely NOAC when close to discharge.  Goal of Therapy:  Heparin level 0.3-0.7 units/ml Monitor platelets by anticoagulation protocol: Yes   Plan:  Continue heparin 1300 units/hr Check confirmatory heparin level at 2000 Daily heparin level and CBC F/u plans for oral anticoagulation.  Maryanna Shape, PharmD, BCPS  Clinical Pharmacist  Pager: [phone]   04/05/2015,1:41 PM

## 2015-04-05 NOTE — Progress Notes (Signed)
Synovial fluid c/w arthritic flare up.  Recommend symptomatic treatment.  Follow up as needed. Ortho signed off  N. Eduard Roux, MD [place] 3:17 PM

## 2015-04-05 NOTE — Progress Notes (Signed)
ANTICOAGULATION CONSULT NOTE - Follow-up Consult  Pharmacy Consult for Heparin Indication: pulmonary embolus  Allergies  Allergen Reactions  . Amoxicillin Nausea Only and Other (See Comments)    GI problems Pt tolerates cephalosporins    Patient Measurements: Height: 5\' 9"  (175.3 cm) Weight: 225 lb 9.6 oz (102.331 kg) IBW/kg (Calculated) : 70.7 Heparin Dosing Weight: ~92kg  Vital Signs: Temp: 98.4 F (36.9 C) (11/05 1334) Temp Source: Oral (11/05 1334) BP: 131/77 mmHg (11/05 1334) Pulse Rate: 68 (11/05 1334)  Labs:  Recent Labs  04/04/15 0515 04/04/15 0531 04/04/15 0816 04/04/15 1345 04/04/15 1930 04/05/15 0123 04/05/15 1123 04/05/15 1944  HGB 14.1 15.6  --   --   --  10.9*  --   --   HCT 43.5 46.0  --   --   --  34.3*  --   --   PLT 205  --   --   --   --  159  --   --   HEPARINUNFRC  --   --   --   --   --  1.08* 0.64 0.69  CREATININE 1.05 1.00  --   --   --  0.72  --   --   CKTOTAL  --   --  20* 23* 26*  --   --   --   CKMB  --   --  0.8 1.4 1.4  --   --   --   TROPONINI  --   --  0.03 <0.03 <0.03  --   --   --     Estimated Creatinine Clearance: 95.4 mL/min (by C-G formula based on Cr of 0.72).   Assessment: 31 YOM who continues on heparin for subacute vs chronic PE with a therapeutic heparin level this evening (HL 0.69 << 0.64, goal of 0.3-0.7). No overt s/sx of bleeding noted. Will reduce rate slightly to keep within range.   Goal of Therapy:  Heparin level 0.3-0.7 units/ml Monitor platelets by anticoagulation protocol: Yes   Plan:  1. Reduce heparin to 1250 units/hr (12.5 ml/hr) 2. Will continue to monitor for any signs/symptoms of bleeding and will follow up with heparin level in the a.m.   Alycia Rossetti, PharmD, BCPS Clinical Pharmacist Pager: [phone] 04/05/2015 8:28 PM

## 2015-04-06 DIAGNOSIS — M25561 Pain in right knee: Secondary | ICD-10-CM

## 2015-04-06 LAB — CBC
HEMATOCRIT: 36.8 % — AB (ref 39.0–52.0)
HEMOGLOBIN: 12.1 g/dL — AB (ref 13.0–17.0)
MCH: 30.3 pg (ref 26.0–34.0)
MCHC: 32.9 g/dL (ref 30.0–36.0)
MCV: 92.2 fL (ref 78.0–100.0)
Platelets: 204 10*3/uL (ref 150–400)
RBC: 3.99 MIL/uL — ABNORMAL LOW (ref 4.22–5.81)
RDW: 15.2 % (ref 11.5–15.5)
WBC: 12.5 10*3/uL — AB (ref 4.0–10.5)

## 2015-04-06 LAB — GLUCOSE, CAPILLARY
GLUCOSE-CAPILLARY: 153 mg/dL — AB (ref 65–99)
GLUCOSE-CAPILLARY: 209 mg/dL — AB (ref 65–99)
GLUCOSE-CAPILLARY: 215 mg/dL — AB (ref 65–99)
GLUCOSE-CAPILLARY: 183 mg/dL — AB (ref 65–99)
Glucose-Capillary: 78 mg/dL (ref 65–99)

## 2015-04-06 LAB — TROPONIN I
Troponin I: 0.09 ng/mL — ABNORMAL HIGH (ref <0.031)
Troponin I: 0.04 ng/mL — ABNORMAL HIGH (ref <0.031)
Troponin I: <0.03 ng/mL (ref <0.031)

## 2015-04-06 LAB — PROCALCITONIN

## 2015-04-06 LAB — HEPARIN LEVEL (UNFRACTIONATED): Heparin Unfractionated: 0.58 IU/mL (ref 0.30–0.70)

## 2015-04-06 MED ORDER — METHYLPREDNISOLONE SODIUM SUCC 40 MG IJ SOLR
40.0000 mg | Freq: Three times a day (TID) | INTRAMUSCULAR | Status: DC
  Administered 2015-04-06 – 2015-04-08 (×7): 40 mg via INTRAVENOUS
  Filled 2015-04-06 (×6): qty 1

## 2015-04-06 MED ORDER — SENNOSIDES-DOCUSATE SODIUM 8.6-50 MG PO TABS
2.0000 | ORAL_TABLET | Freq: Once | ORAL | Status: AC
  Administered 2015-04-06: 2 via ORAL
  Filled 2015-04-06: qty 2

## 2015-04-06 MED ORDER — POLYETHYLENE GLYCOL 3350 17 G PO PACK
17.0000 g | PACK | Freq: Two times a day (BID) | ORAL | Status: DC
  Administered 2015-04-06 – 2015-04-08 (×4): 17 g via ORAL
  Filled 2015-04-06 (×5): qty 1

## 2015-04-06 NOTE — Progress Notes (Signed)
PROGRESS NOTE  DELL BRINER VQX:[id number] DOB: 05-25-1941 DOA: 04/04/2015 PCP: Lance Bosch, NP  HPI: 74 year old male with diabetes mellitus, rheumatoid arthritis, hyperlipidemia, coronary disease, hypertension presented to ED after a fall, diffuse body aches, right knee pain. He appeared septic on admission and was started on broad spectrum antibiotics.   Subjective / 24 H Interval events - feels better today but endorses low energy. Poor historian.  Assessment/Plan: Principal Problem:   Sepsis ([place]) Active Problems:   Rheumatoid arthritis ([facility])   DM2 (diabetes mellitus, type 2) ([facility])   CAD (coronary artery disease)   Hyperlipidemia   HTN (hypertension)   Abdominal pain   Right knee pain   Lactic acidosis   Fall   Acute pulmonary embolism ([facility])   Knee effusion   Mass of urinary bladder   Tobacco abuse   Sepsis ([place]) of unclear etiology - Patient met SIRS criteria with fever, tachycardia, hypoxia, lactic acidosis, leukocytosis - lactic acid normalized - influenza negative, blood cultures negative to date, urinalysis without evidence of UTI - continue empiric antibiotics but without a source, once cultures negative 48 h will narrow antibiotics, likely Monday  Subacute vs chronic PE  - on right upper lobe pulmonary artery, right middle lobe pulmonary artery and branches of the right lower lobe pulmonary Artery. - heparin per pharmacy, likely NOAC when close to discharge  Chest pain  - possibly due to PE - troponin minimally elevated, flat - EKG without acute findings - on heparin gtt for PE  Urinary bladder mass - 4.0 x 6.3 x 5.6 cm mass involving the right posterolateral wall of the urinary bladder at its base. - patient with long standing history of smoking - urology consulted, plan for TURBT as an outpatient   Right knee effusion - patient endorses a history of gout however denies knee swelling or aspirations in the past - knee tapped 11/5, appreciate  ortho help, appears like arthritic flare up without infection or gout findings.  CAD (coronary artery disease) - Currently stable, continue beta blocker. Holding HCTZ, lisinopril due to borderline hypotension  - Continue statins   Chronic diastolic heart failure - euvolemic on exam, received IVF in the setting of sepsis and elevated lactic acid - discontinue fluids today, sepsis physiology resolved  Rheumatoid arthritis ([facility]) - flaring up with pain all over today - Iv steroids. Discontinue prednisone  DM2 (diabetes mellitus, type 2) ([place]), uncontrolled - Placed on sliding scale insulin while inpatient, hold metformin - hemoglobin A1C 7.3 - close monitoring while on steroids  Hyperlipidemia - On statin, TriCor  HTN (hypertension) - Currently BP normal, continue metoprolol but hold HCTZ and lisinopril   Pleuritic right sided chest pain and right sided abdominal pain - LFTs normal, lipase minimally elevated, suspect pain related to PE  COPD - no wheezing, no apparent exacerbation - continue Spiriva, Albuterol prn - extensive mucous plugging on CT, flutter valve  Fall - PTOT evaluation   Tobacco abuse - counseled for cessation   Diet: Diet heart healthy/carb modified Room service appropriate?: Yes; Fluid consistency:: Thin Fluids: none DVT Prophylaxis: heparin gtt  Code Status: Full Code Family Communication: daughter over the phone Disposition Plan: remain inpatient  Barriers to discharge: see above  Consultants:  Urology   Orthopedic surgery   Procedures:  None    Antibiotics Vancomycin 11/4 >> Aztreonam 11/4 >> Levofloxacin 11/4 >>    Studies  No results found.  Objective  Filed Vitals:   04/06/15 0034 04/06/15 0529 04/06/15 9179  04/06/15 0940  BP: 127/90 176/93  180/92  Pulse: 56 95  118  Temp: 98.5 F (36.9 C) 98.3 F (36.8 C)    TempSrc: Oral Oral    Resp: 18 18    Height:      Weight:      SpO2: 98% 97% 96%     Intake/Output  Summary (Last 24 hours) at 04/06/15 1314 Last data filed at 04/06/15 0500  Gross per 24 hour  Intake    240 ml  Output   1950 ml  Net  -1710 ml   Filed Weights   04/04/15 0509 04/04/15 0848  Weight: 104.327 kg (230 lb) 102.331 kg (225 lb 9.6 oz)    Exam:  GENERAL: NAD  HEENT: head NCAT, no scleral icterus. Pupils round and reactive.   NECK: Supple.  LUNGS: Clear to auscultation. No wheezing or crackles. Distant breath sounds  HEART: Regular rate and rhythm without murmur. 2+ pulses, no JVD, trace peripheral edema  ABDOMEN: Soft, obese. Mild tender on right upper quadrant. Positive bowel sounds.  EXTREMITIES: Without any cyanosis or clubbing. Good muscle tone. Right knee swollen, no erythema / warmth. Tender to palpation  NEUROLOGIC: Alert and oriented x3. Cranial nerves II through XII are grossly intact. Strength 5/5 in all 4.  SKIN: No ulceration, induration, rashes present.  Data Reviewed: Basic Metabolic Panel:  Recent Labs Lab 04/04/15 0515 04/04/15 0531 04/05/15 0123  NA 138 138 138  K 4.1 3.9 3.8  CL 101 102 108  CO2 23  --  23  GLUCOSE 147* 146* 108*  BUN 13 15 8   CREATININE 1.05 1.00 0.72  CALCIUM 9.5  --  8.4*   Liver Function Tests:  Recent Labs Lab 04/04/15 0515  AST 21  ALT 15*  ALKPHOS 55  BILITOT 1.1  PROT 6.9  ALBUMIN 3.1*    Recent Labs Lab 04/04/15 0515  LIPASE 57*   CBC:  Recent Labs Lab 04/04/15 0515 04/04/15 0531 04/05/15 0123 04/06/15 0516  WBC 13.8*  --  9.3 12.5*  NEUTROABS 11.1*  --   --   --   HGB 14.1 15.6 10.9* 12.1*  HCT 43.5 46.0 34.3* 36.8*  MCV 92.0  --  91.7 92.2  PLT 205  --  159 204   Cardiac Enzymes:  Recent Labs Lab 04/04/15 0816 04/04/15 1345 04/04/15 1930 04/06/15 0516  CKTOTAL 20* 23* 26*  --   CKMB 0.8 1.4 1.4  --   TROPONINI 0.03 <0.03 <0.03 0.09*   CBG:  Recent Labs Lab 04/05/15 1216 04/05/15 1651 04/05/15 2122 04/06/15 0748 04/06/15 1201  GLUCAP 92 164* 169* 78 153*     Recent Results (from the past 240 hour(s))  Culture, blood (routine x 2)     Status: None (Preliminary result)   Collection Time: 04/04/15  5:15 AM  Result Value Ref Range Status   Specimen Description BLOOD LEFT ANTECUBITAL  Final   Special Requests BOTTLES DRAWN AEROBIC AND ANAEROBIC 5CC  Final   Culture NO GROWTH 2 DAYS  Final   Report Status PENDING  Incomplete  Culture, blood (routine x 2)     Status: None (Preliminary result)   Collection Time: 04/04/15  5:25 AM  Result Value Ref Range Status   Specimen Description BLOOD RIGHT ANTECUBITAL  Final   Special Requests BOTTLES DRAWN AEROBIC AND ANAEROBIC 5CC  Final   Culture NO GROWTH 2 DAYS  Final   Report Status PENDING  Incomplete  Urine culture  Status: None   Collection Time: 04/04/15 12:29 PM  Result Value Ref Range Status   Specimen Description URINE, RANDOM  Final   Special Requests NONE  Final   Culture NO GROWTH 1 DAY  Final   Report Status 04/05/2015 FINAL  Final  Gram stain     Status: None   Collection Time: 04/05/15 10:23 AM  Result Value Ref Range Status   Specimen Description FLUID SYNOVIAL RIGHT KNEE  Final   Special Requests Normal  Final   Gram Stain   Final    ABUNDANT WBC PRESENT,BOTH PMN AND MONONUCLEAR NO ORGANISMS SEEN    Report Status 04/05/2015 FINAL  Final  Culture, body fluid-bottle     Status: None (Preliminary result)   Collection Time: 04/05/15 10:23 AM  Result Value Ref Range Status   Specimen Description FLUID SYNOVIAL RIGHT KNEE  Final   Special Requests NONE  Final   Culture NO GROWTH < 24 HOURS  Final   Report Status PENDING  Incomplete     Scheduled Meds: . aztreonam  1 g Intravenous Q8H  . colchicine  0.6 mg Oral Daily  . feeding supplement (ENSURE ENLIVE)  237 mL Oral Q24H  . fenofibrate  54 mg Oral Daily  . folic acid  1 mg Oral Daily  . insulin aspart  0-5 Units Subcutaneous QHS  . insulin aspart  0-9 Units Subcutaneous TID WC  . levofloxacin (LEVAQUIN) IV  750 mg  Intravenous Q24H  . methylPREDNISolone (SOLU-MEDROL) injection  40 mg Intravenous 3 times per day  . metoprolol tartrate  25 mg Oral Daily  . polyethylene glycol  17 g Oral BID  . rosuvastatin  5 mg Oral Daily  . sodium chloride  3 mL Intravenous Q12H  . tiotropium  18 mcg Inhalation Daily  . vancomycin  750 mg Intravenous Q12H   Continuous Infusions: . heparin 1,250 Units/hr (04/06/15 0929)     Marzetta Board, MD [facility] Pager [phone]. If 7 PM - 7 AM, please contact night-coverage at [email or web address], password [facility] 04/06/2015, 1:14 PM  LOS: 2 days

## 2015-04-06 NOTE — Progress Notes (Signed)
Patient with complaints of sharp chest pain. Abdomen distended, firm with pain radiating to left side at @ 0150. LBM 04/02/15. Will continue to monitor patient for chest pain.adm                                                                                                                 adm

## 2015-04-06 NOTE — Care Management Important Message (Signed)
Important Message  Patient Details  Name: Jorge Malone MRN: [id number] Date of Birth: 12-20-1940   Medicare Important Message Given:  Yes-second notification given    Nathen May 04/06/2015, 10:19 AM

## 2015-04-06 NOTE — Progress Notes (Signed)
ANTICOAGULATION CONSULT NOTE - Follow Up Consult  Pharmacy Consult for Heparin Indication: pulmonary embolus  Allergies  Allergen Reactions  . Amoxicillin Nausea Only and Other (See Comments)    GI problems Pt tolerates cephalosporins    Patient Measurements: Height: 5\' 9"  (175.3 cm) Weight: 225 lb 9.6 oz (102.331 kg) IBW/kg (Calculated) : 70.7 Heparin Dosing Weight: ~92 kg  Vital Signs: Temp: 98.3 F (36.8 C) (11/06 0529) Temp Source: Oral (11/06 0529) BP: 176/93 mmHg (11/06 0529) Pulse Rate: 95 (11/06 0529)  Labs:  Recent Labs  04/04/15 0515 04/04/15 0531  04/04/15 0816 04/04/15 1345 04/04/15 1930  04/05/15 0123 04/05/15 1123 04/05/15 1944 04/06/15 0516  HGB 14.1 15.6  --   --   --   --   --  10.9*  --   --  12.1*  HCT 43.5 46.0  --   --   --   --   --  34.3*  --   --  36.8*  PLT 205  --   --   --   --   --   --  159  --   --  PENDING  HEPARINUNFRC  --   --   --   --   --   --   < > 1.08* 0.64 0.69 0.58  CREATININE 1.05 1.00  --   --   --   --   --  0.72  --   --   --   CKTOTAL  --   --   --  20* 23* 26*  --   --   --   --   --   CKMB  --   --   --  0.8 1.4 1.4  --   --   --   --   --   TROPONINI  --   --   < > 0.03 <0.03 <0.03  --   --   --   --  0.09*  < > = values in this interval not displayed.  Estimated Creatinine Clearance: 95.4 mL/min (by C-G formula based on Cr of 0.72).   Medications:  Scheduled:  . aztreonam  1 g Intravenous Q8H  . colchicine  0.6 mg Oral Daily  . feeding supplement (ENSURE ENLIVE)  237 mL Oral Q24H  . fenofibrate  54 mg Oral Daily  . folic acid  1 mg Oral Daily  . insulin aspart  0-5 Units Subcutaneous QHS  . insulin aspart  0-9 Units Subcutaneous TID WC  . levofloxacin (LEVAQUIN) IV  750 mg Intravenous Q24H  . metoprolol tartrate  25 mg Oral Daily  . predniSONE  20 mg Oral Q breakfast  . rosuvastatin  5 mg Oral Daily  . sodium chloride  3 mL Intravenous Q12H  . tiotropium  18 mcg Inhalation Daily  . vancomycin  750 mg  Intravenous Q12H   Infusions:  . heparin 1,250 Units/hr (04/06/15 0639)   PRN: acetaminophen **OR** acetaminophen, albuterol, diphenhydrAMINE, HYDROcodone-acetaminophen, HYDROmorphone (DILAUDID) injection, ondansetron **OR** ondansetron (ZOFRAN) IV  Assessment: 32 YOM who continues on heparin for subacute vs chronic PE with a therapeutic heparin level (HL 0.58, goal of 0.3-0.7). No s/sx of bleeding noted.  Hgb 12.1, Plts 204.    Likely NOAC when close to discharge  Goal of Therapy:  Heparin level 0.3-0.7 units/ml Monitor platelets by anticoagulation protocol: Yes   Plan:  Continue Heparin gtt at 1250 units/hr Monitor daily HL/CBC Monitor for s/sx of bleeding  Bennye Alm, PharmD Pharmacy Resident  [phone] 04/06/2015,7:44 AM

## 2015-04-06 NOTE — Progress Notes (Signed)
Shift event note: Notified by RN at approx 0415 regarding pt c/o (L) sided CP. Pt did not c/o other associated sx's. EKG reviewed w/ Dr Arnoldo Morale who questions possible st depression in V-2 and V-3. Pt was given Dil 1 mg IV and CP resolved after a short period of time and pt rested remainder of morning in NAD. First troponin elevated at 0.09. I discussed pt w/ Dr Eula Fried w/ cardiology service at [phone] who feels EKG is w/o acute changes. He had no urgent recommendations and requested we re-consult cardiology service this am if indicated.  Assessment/Plan: 1. Chest pain: Etiology unclear though likely related to PE. Discussed pt w/ Dr Cruzita Lederer and relayed recommendation of Dr Eula Fried regarding re-consulting cards this am if indicated. Will continue to cycle troponin's and monitor closely on telemetry.   Jeryl Columbia, NP-C [facility] Pager [phone]

## 2015-04-07 DIAGNOSIS — I2510 Atherosclerotic heart disease of native coronary artery without angina pectoris: Secondary | ICD-10-CM

## 2015-04-07 LAB — CBC
HEMATOCRIT: 35.3 % — AB (ref 39.0–52.0)
Hemoglobin: 11.3 g/dL — ABNORMAL LOW (ref 13.0–17.0)
MCH: 29 pg (ref 26.0–34.0)
MCHC: 32 g/dL (ref 30.0–36.0)
MCV: 90.7 fL (ref 78.0–100.0)
PLATELETS: 183 10*3/uL (ref 150–400)
RBC: 3.89 MIL/uL — ABNORMAL LOW (ref 4.22–5.81)
RDW: 15.2 % (ref 11.5–15.5)
WBC: 11 10*3/uL — AB (ref 4.0–10.5)

## 2015-04-07 LAB — URINALYSIS, ROUTINE W REFLEX MICROSCOPIC
Bilirubin Urine: NEGATIVE
Glucose, UA: 100 mg/dL — AB
Hgb urine dipstick: NEGATIVE
Ketones, ur: NEGATIVE mg/dL
LEUKOCYTES UA: NEGATIVE
NITRITE: NEGATIVE
PH: 5.5 (ref 5.0–8.0)
Protein, ur: NEGATIVE mg/dL
SPECIFIC GRAVITY, URINE: 1.012 (ref 1.005–1.030)
Urobilinogen, UA: 0.2 mg/dL (ref 0.0–1.0)

## 2015-04-07 LAB — HEPARIN LEVEL (UNFRACTIONATED): HEPARIN UNFRACTIONATED: 0.58 [IU]/mL (ref 0.30–0.70)

## 2015-04-07 LAB — GLUCOSE, CAPILLARY
GLUCOSE-CAPILLARY: 152 mg/dL — AB (ref 65–99)
GLUCOSE-CAPILLARY: 118 mg/dL — AB (ref 65–99)
Glucose-Capillary: 139 mg/dL — ABNORMAL HIGH (ref 65–99)
Glucose-Capillary: 215 mg/dL — ABNORMAL HIGH (ref 65–99)

## 2015-04-07 MED ORDER — GLYCERIN (LAXATIVE) 2.1 G RE SUPP
1.0000 | Freq: Every day | RECTAL | Status: DC | PRN
  Filled 2015-04-07: qty 1

## 2015-04-07 MED ORDER — PSYLLIUM 95 % PO PACK
1.0000 | PACK | Freq: Every day | ORAL | Status: DC
  Administered 2015-04-07 – 2015-04-08 (×2): 1 via ORAL
  Filled 2015-04-07 (×2): qty 1

## 2015-04-07 MED ORDER — BISACODYL 5 MG PO TBEC
5.0000 mg | DELAYED_RELEASE_TABLET | Freq: Once | ORAL | Status: AC
  Administered 2015-04-07: 5 mg via ORAL
  Filled 2015-04-07: qty 1

## 2015-04-07 NOTE — Progress Notes (Addendum)
PROGRESS NOTE  Jorge Malone WIO:[id number] DOB: May 05, 1941 DOA: 04/04/2015 PCP: Lance Bosch, NP  HPI: 74 year old male with diabetes mellitus, rheumatoid arthritis, hyperlipidemia, coronary disease, hypertension presented to ED after a fall, diffuse body aches, right knee pain. He appeared septic on admission and was started on broad spectrum antibiotics.   Subjective / 24 H Interval events - appreciates improvement in his generalized pain  Assessment/Plan: Principal Problem:   Sepsis ([place]) Active Problems:   Rheumatoid arthritis (HCC)   DM2 (diabetes mellitus, type 2) (HCC)   CAD (coronary artery disease)   Hyperlipidemia   HTN (hypertension)   Abdominal pain   Right knee pain   Lactic acidosis   Fall   Acute pulmonary embolism (HCC)   Knee effusion   Mass of urinary bladder   Tobacco abuse   Sepsis ([place]) of unclear etiology - Patient met SIRS criteria with fever, tachycardia, hypoxia, lactic acidosis, leukocytosis - lactic acid normalized - influenza negative, blood cultures negative to date, urinalysis without evidence of UTI - no source found, stop vancomycin and aztreonam today, empiric levofloxacin to continue  Subacute vs chronic PE  - on right upper lobe pulmonary artery, right middle lobe pulmonary artery and branches of the right lower lobe pulmonary Artery. - heparin per pharmacy, likely NOAC when close to discharge. Close monitoring for bleeding.  Chest pain  - possibly due to PE - troponin minimally elevated, flat - EKG without acute findings - on heparin gtt for PE  Urinary bladder mass - 4.0 x 6.3 x 5.6 cm mass involving the right posterolateral wall of the urinary bladder at its base. - patient with long standing history of smoking - urology consulted, plan for TURBT as an outpatient   Right knee effusion - patient endorses a history of gout however denies knee swelling or aspirations in the past - knee tapped 11/5, appreciate ortho help,  appears like arthritic flare up without infection or gout findings. - cultures still pending today, no growth  CAD (coronary artery disease) - Currently stable, continue beta blocker. Holding HCTZ, lisinopril due to borderline hypotension  - Continue statins   Chronic diastolic heart failure - euvolemic on exam, received IVF in the setting of sepsis and elevated lactic acid - discontinued fluids   Rheumatoid arthritis (HCC) - Iv steroids on 11/6, generalized arthritic pain improving today - prednisone taper on discharge  DM2 (diabetes mellitus, type 2) ([place]), uncontrolled - Placed on sliding scale insulin while inpatient, hold metformin - hemoglobin A1C 7.3 - close monitoring while on steroids - fasting CBG 152, continue same regimen   Hyperlipidemia - On statin, [facility]  HTN (hypertension) - Currently BP normal at 132/78 this morning, continue metoprolol but hold HCTZ and lisinopril   Pleuritic right sided chest pain and right sided abdominal pain - LFTs normal, lipase minimally elevated, suspect pain related to PE  COPD - no wheezing, no apparent exacerbation - continue Spiriva, Albuterol prn - extensive mucous plugging on CT, flutter valve  Fall - PTOT evaluation   Tobacco abuse - counseled for cessation  Constipation - aggressive regimen  Diet: Diet heart healthy/carb modified Room service appropriate?: Yes; Fluid consistency:: Thin Fluids: none DVT Prophylaxis: heparin gtt  Code Status: Full Code Family Communication: no family bedside today  Disposition Plan: remain inpatient  Barriers to discharge: observe 24 h on 1 antibiotic only, d/c SNF tomorrow if stable  Consultants:  Urology   Orthopedic surgery   Procedures:  None    Antibiotics Vancomycin  11/4 >> 11/7 Aztreonam 11/4 >> 11/7 Levofloxacin 11/4 >>    Studies  No results found.  Objective  Filed Vitals:   04/06/15 0940 04/06/15 1426 04/06/15 2121 04/07/15 0526  BP: 180/92  123/79 132/72 132/78  Pulse: 118 98 77 73  Temp:  100 F (37.8 C) 98.7 F (37.1 C) 98.6 F (37 C)  TempSrc:  Oral Oral Oral  Resp:  _0 Height:      Weight:      SpO2:   94% 91%    Intake/Output Summary (Last 24 hours) at 04/07/15 1023 Last data filed at 04/07/15 0844  Gross per 24 hour  Intake    480 ml  Output      0 ml  Net    480 ml   Filed Weights   04/04/15 0509 04/04/15 0848  Weight: 104.327 kg (230 lb) 102.331 kg (225 lb 9.6 oz)    Exam:  GENERAL: NAD  HEENT: head NCAT, no scleral icterus. Pupils round and reactive.   NECK: Supple.  LUNGS: Clear to auscultation. No wheezing or crackles. Distant breath sounds  HEART: Regular rate and rhythm without murmur. 2+ pulses, no JVD, trace peripheral edema  ABDOMEN: Soft, obese. Mild tender on right upper quadrant. Positive bowel sounds.  EXTREMITIES: Without any cyanosis or clubbing. Good muscle tone. Right knee swollen, no erythema / warmth. Tender to palpation  NEUROLOGIC: non focal   SKIN: No ulceration, induration, rashes present.  Data Reviewed: Basic Metabolic Panel:  Recent Labs Lab 04/04/15 0515 04/04/15 0531 04/05/15 0123  NA 138 138 138  K 4.1 3.9 3.8  CL 101 102 108  CO2 23  --  23  GLUCOSE 147* 146* 108*  BUN _1 CREATININE 1.05 1.00 0.72  CALCIUM 9.5  --  8.4*   Liver Function Tests:  Recent Labs Lab 04/04/15 0515  AST 21  ALT 15*  ALKPHOS 55  BILITOT 1.1  PROT 6.9  ALBUMIN 3.1*    Recent Labs Lab 04/04/15 0515  LIPASE 57*   CBC:  Recent Labs Lab 04/04/15 0515 04/04/15 0531 04/05/15 0123 04/06/15 0516 04/07/15 0554  WBC 13.8*  --  9.3 12.5* 11.0*  NEUTROABS 11.1*  --   --   --   --   HGB 14.1 15.6 10.9* 12.1* 11.3*  HCT 43.5 46.0 34.3* 36.8* 35.3*  MCV 92.0  --  91.7 92.2 90.7  PLT 205  --  159 204 183   Cardiac Enzymes:  Recent Labs Lab 04/04/15 0816 04/04/15 1345 04/04/15 1930 04/06/15 0516 04/06/15 1130 04/06/15 1608  CKTOTAL 20* 23*  26*  --   --   --   CKMB 0.8 1.4 1.4  --   --   --   TROPONINI 0.03 <0.03 <0.03 0.09* 0.04* <0.03   CBG:  Recent Labs Lab 04/06/15 1201 04/06/15 1704 04/06/15 2122 04/06/15 2246 04/07/15 0811  GLUCAP 153* 209* 215* 183* 152*    Recent Results (from the past 240 hour(s))  Culture, blood (routine x 2)     Status: None (Preliminary result)   Collection Time: 04/04/15  5:15 AM  Result Value Ref Range Status   Specimen Description BLOOD LEFT ANTECUBITAL  Final   Special Requests BOTTLES DRAWN AEROBIC AND ANAEROBIC 5CC  Final   Culture NO GROWTH 2 DAYS  Final   Report Status PENDING  Incomplete  Culture, blood (routine x 2)     Status: None (Preliminary result)   Collection Time: 04/04/15  5:25 AM  Result Value Ref Range Status   Specimen Description BLOOD RIGHT ANTECUBITAL  Final   Special Requests BOTTLES DRAWN AEROBIC AND ANAEROBIC 5CC  Final   Culture NO GROWTH 2 DAYS  Final   Report Status PENDING  Incomplete  Urine culture     Status: None   Collection Time: 04/04/15 12:29 PM  Result Value Ref Range Status   Specimen Description URINE, RANDOM  Final   Special Requests NONE  Final   Culture NO GROWTH 1 DAY  Final   Report Status 04/05/2015 FINAL  Final  Gram stain     Status: None   Collection Time: 04/05/15 10:23 AM  Result Value Ref Range Status   Specimen Description FLUID SYNOVIAL RIGHT KNEE  Final   Special Requests Normal  Final   Gram Stain   Final    ABUNDANT WBC PRESENT,BOTH PMN AND MONONUCLEAR NO ORGANISMS SEEN    Report Status 04/05/2015 FINAL  Final  Culture, body fluid-bottle     Status: None (Preliminary result)   Collection Time: 04/05/15 10:23 AM  Result Value Ref Range Status   Specimen Description FLUID SYNOVIAL RIGHT KNEE  Final   Special Requests NONE  Final   Culture NO GROWTH < 24 HOURS  Final   Report Status PENDING  Incomplete     Scheduled Meds: . colchicine  0.6 mg Oral Daily  . feeding supplement (ENSURE ENLIVE)  237 mL Oral Q24H    . fenofibrate  54 mg Oral Daily  . folic acid  1 mg Oral Daily  . insulin aspart  0-5 Units Subcutaneous QHS  . insulin aspart  0-9 Units Subcutaneous TID WC  . levofloxacin (LEVAQUIN) IV  750 mg Intravenous Q24H  . methylPREDNISolone (SOLU-MEDROL) injection  40 mg Intravenous 3 times per day  . metoprolol tartrate  25 mg Oral Daily  . polyethylene glycol  17 g Oral BID  . psyllium  1 packet Oral Daily  . rosuvastatin  5 mg Oral Daily  . sodium chloride  3 mL Intravenous Q12H  . tiotropium  18 mcg Inhalation Daily   Continuous Infusions: . heparin 1,250 Units/hr (04/07/15 0409)     Marzetta Board, MD [facility] Pager [phone]. If 7 PM - 7 AM, please contact night-coverage at [email or web address], password [facility] 04/07/2015, 10:23 AM  LOS: 3 days

## 2015-04-07 NOTE — Clinical Social Work Placement (Signed)
   CLINICAL SOCIAL WORK PLACEMENT  NOTE  Date:  04/07/2015  Patient Details  Name: Jorge Malone MRN: [id number] Date of Birth: [birth year]  Clinical Social Work is seeking post-discharge placement for this patient at the [place] level of care (*CSW will initial, date and re-position this form in  chart as items are completed):  Yes   Patient/family provided with [facility] Work Department's list of facilities offering this level of care within the geographic area requested by the patient (or if unable, by the patient's family).  Yes   Patient/family informed of their freedom to choose among providers that offer the needed level of care, that participate in Medicare, Medicaid or managed care program needed by the patient, have an available bed and are willing to accept the patient.  Yes   Patient/family informed of [facility]'s ownership interest in [facility] and [facility], as well as of the fact that they are under no obligation to receive care at these facilities.  PASRR submitted to EDS on 04/07/15     PASRR number received on 04/07/15     Existing PASRR number confirmed on       FL2 transmitted to all facilities in geographic area requested by pt/family on 04/07/15     FL2 transmitted to all facilities within larger geographic area on       Patient informed that his/her managed care company has contracts with or will negotiate with certain facilities, including the following:            Patient/family informed of bed offers received.  Patient chooses bed at       Physician recommends and patient chooses bed at      Patient to be transferred to   on  .  Patient to be transferred to facility by       Patient family notified on   of transfer.  Name of family member notified:        PHYSICIAN Please sign FL2     Additional Comment:    _______________________________________________ Cranford Mon, LCSW 04/07/2015,  11:25 AM

## 2015-04-07 NOTE — Progress Notes (Signed)
ANTICOAGULATION CONSULT NOTE - Follow Up Consult  Pharmacy Consult for heparin Indication: pulmonary embolus  Allergies  Allergen Reactions  . Amoxicillin Nausea Only and Other (See Comments)    GI problems Pt tolerates cephalosporins    Patient Measurements: Height: 5\' 9"  (175.3 cm) Weight: 225 lb 9.6 oz (102.331 kg) IBW/kg (Calculated) : 70.7 Heparin Dosing Weight: ~92 kg  Vital Signs: Temp: 98.6 F (37 C) (11/07 0526) Temp Source: Oral (11/07 0526) BP: 132/78 mmHg (11/07 0526) Pulse Rate: 73 (11/07 0526)  Labs:  Recent Labs  04/04/15 1345 04/04/15 1930  04/05/15 0123  04/05/15 1944 04/06/15 0516 04/06/15 1130 04/06/15 1608 04/07/15 0554  HGB  --   --   < > 10.9*  --   --  12.1*  --   --  11.3*  HCT  --   --   --  34.3*  --   --  36.8*  --   --  35.3*  PLT  --   --   --  159  --   --  204  --   --  183  HEPARINUNFRC  --   --   --  1.08*  < > 0.69 0.58  --   --  0.58  CREATININE  --   --   --  0.72  --   --   --   --   --   --   CKTOTAL 23* 26*  --   --   --   --   --   --   --   --   CKMB 1.4 1.4  --   --   --   --   --   --   --   --   TROPONINI <0.03 <0.03  --   --   --   --  0.09* 0.04* <0.03  --   < > = values in this interval not displayed.  Estimated Creatinine Clearance: 95.4 mL/min (by C-G formula based on Cr of 0.72).   Medications:  Infusions:  . heparin 1,250 Units/hr (04/07/15 0409)    Assessment: 74 YOM who continues on heparin for subacute vs chronic PE with a therapeutic heparin level (HL 0.58, goal of 0.3-0.7). No s/sx of bleeding noted, CBC is stable.    Likely NOAC when close to discharge.  Goal of Therapy:  Heparin level 0.3-0.7 units/ml Monitor platelets by anticoagulation protocol: Yes   Plan:  Continue heparin drip at 1250 units/hr Monitor daily heparin level/CBC Monitor for s/sx of bleeding  [facility], [place].D., BCPS Clinical Pharmacist Pager: [phone] 04/07/2015 11:24 AM

## 2015-04-07 NOTE — Care Management Note (Signed)
Case Management Note  Patient Details  Name: Jorge Malone MRN: [id number] Date of Birth: [birth year]  Subjective/Objective:     Per pt eval rec snf, CSW following. Patient will be transition to po levaquin, conts on hep drip, plan for dc tomorrow to snf.               Action/Plan:   Expected Discharge Date:                  Expected Discharge Plan:  Skilled Nursing Facility  In-House Referral:  Clinical Social Work  Discharge planning Services  CM Consult  Post Acute Care Choice:    Choice offered to:     DME Arranged:    DME Agency:     HH Arranged:    [place] Agency:     Status of Service:  Completed, signed off  Medicare Important Message Given:  Yes-second notification given Date Medicare IM Given:    Medicare IM give by:    Date Additional Medicare IM Given:    Additional Medicare Important Message give by:     If discussed at [place] of Stay Meetings, dates discussed:    Additional Comments:  Zenon Mayo, RN 04/07/2015, 4:41 PM

## 2015-04-07 NOTE — NC FL2 (Signed)
[place] LEVEL OF CARE SCREENING TOOL     IDENTIFICATION  Patient Name: Jorge Malone Birthdate: September 10, 1940 Sex: male Admission Date (Current Location): 04/04/2015  [facility] and [place] Number: Herbalist and Address:  [facility] [address], [place], [place] [address]      Provider Number: [id number]  Attending Physician Name and Address:  Caren Griffins, MD  Relative Name and Phone Number:       Current Level of Care: Hospital Recommended Level of Care: [place] Prior Approval Number:    Date Approved/Denied:   PASRR Number:   [id number]  Discharge Plan: SNF    Current Diagnoses: Patient Active Problem List   Diagnosis Date Noted  . Acute pulmonary embolism ([place]) 04/05/2015  . Knee effusion 04/05/2015  . Mass of urinary bladder 04/05/2015  . Tobacco abuse 04/05/2015  . Sepsis ([place]) 04/04/2015  . Abdominal pain 04/04/2015  . Right knee pain 04/04/2015  . Lactic acidosis 04/04/2015  . Fall 04/04/2015  . HTN (hypertension) 01/10/2015  . Volume overload 12/14/2014  . Hyperlipidemia 12/13/2014  . COPD exacerbation ([place]) 12/12/2014  . Rheumatoid arthritis ([place]) 12/12/2014  . DM2 (diabetes mellitus, type 2) ([place]) 12/12/2014  . CAD (coronary artery disease) 12/12/2014  . Acute respiratory failure with hypoxia ([place]) 12/12/2014    Orientation ACTIVITIES/SOCIAL BLADDER RESPIRATION    Self, Time, Situation, Place    Continent O2 (As needed) (2L)  BEHAVIORAL SYMPTOMS/MOOD NEUROLOGICAL BOWEL NUTRITION STATUS      Continent Diet (clear liquid)  PHYSICIAN VISITS COMMUNICATION OF NEEDS Height & Weight Skin    Verbally 5' 9"  (175.3 cm) 225 lbs. Normal          AMBULATORY STATUS RESPIRATION    Assist extensive O2 (As needed) (2L)      Personal Care Assistance Level of Assistance  Bathing, Dressing Bathing Assistance: Limited assistance Feeding assistance: Independent Dressing Assistance:  Limited assistance      Functional Limitations Info                [place]  PT (By licensed PT)     PT Frequency: 5/wk             Additional Factors Info  Allergies, Insulin Sliding Scale   Allergies Info: amoxicillin   Insulin Sliding Scale Info: 4/day       Current Medications (04/07/2015): Current Facility-Administered Medications  Medication Dose Route Frequency Provider Last Rate Last Dose  . acetaminophen (TYLENOL) tablet 650 mg  650 mg Oral Q6H PRN Ripudeep Krystal Eaton, MD       Or  . acetaminophen (TYLENOL) suppository 650 mg  650 mg Rectal Q6H PRN Ripudeep K Rai, MD      . albuterol (PROVENTIL) (2.5 MG/3ML) 0.083% nebulizer solution 2.5 mg  2.5 mg Nebulization Q2H PRN Ripudeep K Rai, MD      . colchicine tablet 0.6 mg  0.6 mg Oral Daily Ripudeep K Rai, MD   0.6 mg at 04/07/15 0901  . diphenhydrAMINE (BENADRYL) capsule 25 mg  25 mg Oral Q6H PRN Ripudeep K Rai, MD      . feeding supplement (ENSURE ENLIVE) (ENSURE ENLIVE) liquid 237 mL  237 mL Oral Q24H Oswaldo Milian, RD   237 mL at 04/06/15 1545  . fenofibrate tablet 54 mg  54 mg Oral Daily Ripudeep K Rai, MD   54 mg at 04/07/15 1049  . folic acid (FOLVITE) tablet 1 mg  1 mg Oral Daily Ripudeep Krystal Eaton, MD   1 mg at 04/07/15 0901  . Glycerin (Adult) 2.1 G suppository 1 suppository  1 suppository Rectal Daily PRN Costin Karlyne Greenspan, MD      . heparin ADULT infusion 100 units/mL (25000 units/250 mL)  1,250 Units/hr Intravenous Continuous Rolla Flatten, RPH 12.5 mL/hr at 04/07/15 0409 1,250 Units/hr at 04/07/15 0409  . HYDROcodone-acetaminophen (NORCO/VICODIN) 5-325 MG per tablet 1-2 tablet  1-2 tablet Oral Q4H PRN Ripudeep Krystal Eaton, MD   2 tablet at 04/06/15 1612  . HYDROmorphone (DILAUDID) injection 1 mg  1 mg Intravenous Q4H PRN Ripudeep K Rai, MD   1 mg at 04/05/15 1124  . insulin aspart (novoLOG) injection 0-5 Units  0-5 Units Subcutaneous QHS Ripudeep Krystal Eaton, MD   0 Units at 04/04/15 2256  .  insulin aspart (novoLOG) injection 0-9 Units  0-9 Units Subcutaneous TID WC Ripudeep Krystal Eaton, MD   2 Units at 04/07/15 0902  . levofloxacin (LEVAQUIN) IVPB 750 mg  750 mg Intravenous Q24H Darnell Level Mancheril, RPH   750 mg at 04/07/15 0263  . methylPREDNISolone sodium succinate (SOLU-MEDROL) 40 mg/mL injection 40 mg  40 mg Intravenous 3 times per day Caren Griffins, MD   40 mg at 04/07/15 0522  . metoprolol tartrate (LOPRESSOR) tablet 25 mg  25 mg Oral Daily Ripudeep Krystal Eaton, MD   25 mg at 04/07/15 0901  . ondansetron (ZOFRAN) tablet 4 mg  4 mg Oral Q6H PRN Ripudeep K Rai, MD       Or  . ondansetron (ZOFRAN) injection 4 mg  4 mg Intravenous Q6H PRN Ripudeep K Rai, MD      . polyethylene glycol (MIRALAX / GLYCOLAX) packet 17 g  17 g Oral BID Caren Griffins, MD   17 g at 04/07/15 0903  . psyllium (HYDROCIL/METAMUCIL) packet 1 packet  1 packet Oral Daily Costin Karlyne Greenspan, MD   1 packet at 04/07/15 1049  . rosuvastatin (CRESTOR) tablet 5 mg  5 mg Oral Daily Ripudeep Krystal Eaton, MD   5 mg at 04/07/15 0901  . sodium chloride 0.9 % injection 3 mL  3 mL Intravenous Q12H Ripudeep K Rai, MD   3 mL at 04/06/15 0941  . tiotropium (SPIRIVA) inhalation capsule 18 mcg  18 mcg Inhalation Daily Ripudeep Krystal Eaton, MD   18 mcg at 04/07/15 0914   Do not use this list as official medication orders. Please verify with discharge summary.  Discharge Medications:   Medication List    ASK your doctor about these medications        budesonide-formoterol 160-4.5 MCG/ACT inhaler  Commonly known as:  SYMBICORT  Inhale 2 puffs into the lungs 2 (two) times daily.     colchicine 0.6 MG tablet  Take 0.6 mg by mouth 2 (two) times daily as needed.     ENBREL SURECLICK 50 MG/ML injection  Generic drug:  etanercept  Inject 50 mg into the skin once a week. On Sundays     fenofibrate 48 MG tablet  Commonly known as:  TRICOR  Take 48 mg by mouth daily.     folic acid 1 MG tablet  Commonly known as:  FOLVITE  Take 1 tablet (1  mg total) by mouth daily.     glucose blood test strip  Commonly known as:  TRUETEST TEST  Check sugars twice per day for E11.9     glucose blood test strip  Use as instructed  guaiFENesin 600 MG 12 hr tablet  Commonly known as:  MUCINEX  Take 2 tablets (1,200 mg total) by mouth 2 (two) times daily. Take for 4 days then stop.     hydrochlorothiazide 25 MG tablet  Commonly known as:  HYDRODIURIL  Take 1 tablet (25 mg total) by mouth daily.     HYDROcodone-acetaminophen 5-325 MG tablet  Commonly known as:  NORCO/VICODIN  Take 2 tablets by mouth every 4 (four) hours as needed.     levalbuterol 0.63 MG/3ML nebulizer solution  Commonly known as:  XOPENEX  Take 3 mLs (0.63 mg total) by nebulization every 6 (six) hours as needed for wheezing or shortness of breath. Use 3 times daily x 4 days, then every 6 hours as needed for SOB.     lisinopril 2.5 MG tablet  Commonly known as:  PRINIVIL,ZESTRIL  Take 1 tablet (2.5 mg total) by mouth daily.     lisinopril 2.5 MG tablet  Commonly known as:  ZESTRIL  Take 1 tablet (2.5 mg total) by mouth daily.     metFORMIN 1000 MG tablet  Commonly known as:  GLUCOPHAGE  Take 1 tablet (1,000 mg total) by mouth daily with breakfast.     metoprolol tartrate 25 MG tablet  Commonly known as:  LOPRESSOR  Take 1 tablet (25 mg total) by mouth daily.     multivitamin tablet  Take 1 tablet by mouth daily.     ONE TOUCH BASIC SYSTEM W/DEVICE Kit  TID and QHS     predniSONE 20 MG tablet  Commonly known as:  DELTASONE  Take 1 tablet (20 mg total) by mouth daily.     rosuvastatin 5 MG tablet  Commonly known as:  CRESTOR  Take 5 mg by mouth daily.     tiotropium 18 MCG inhalation capsule  Commonly known as:  SPIRIVA HANDIHALER  Place 1 capsule (18 mcg total) into inhaler and inhale daily.     traMADol 50 MG tablet  Commonly known as:  ULTRAM  Take 1 tablet (50 mg total) by mouth every 8 (eight) hours as needed.        Relevant Imaging  Results:  Relevant Lab Results:  Recent Labs    Additional Information    Cranford Mon, LCSW

## 2015-04-07 NOTE — Evaluation (Signed)
Occupational Therapy Evaluation Patient Details Name: Jorge Malone MRN: [id number] DOB: [birth year] Today's Date: 04/07/2015    History of Present Illness Patient is a 74 y/o male presents s/p fall, body aches and right knee pain admitted with sepsis. CXR showed atelectasis with cardiomegaly. Knee xray-Soft tissue swelling and right knee joint effusion.PMH includes RA, DM, COPD, HTN, HLD, Ca.   Clinical Impression   Pt admitted with above diagnosis and deficits listed below (see OT Problem List).  Pt presents with generalized weakness impacting ability to complete ADLs a prior level.   Pt reports multiple falls at home.  Required min assist with sit > stand this session, with tactile cues to elicit anterior weight shift for sit > stand and cues for hand placement to increase safety.  Pt stood at sink to complete grooming tasks with good activity tolerance in standing.  Pt would benefit from further rehab at venue listed below to maximize independence with ADLs and functional mobility prior to returning home.    Follow Up Recommendations  SNF    Equipment Recommendations  Tub/shower bench (TBD)    Recommendations for Other Services       Precautions / Restrictions Precautions Precautions: Fall Restrictions Weight Bearing Restrictions: No      Mobility  Transfers Overall transfer level: Needs assistance Equipment used: Rolling walker (2 wheeled) Transfers: Sit to/from Stand Sit to Stand: Min assist;From elevated surface         General transfer comment: Multiple attempts to stand without assist per pt request however unable. Min A to boost from EOB with cues for forward momentum and hand placement.  Transferred to chair post ambulation bout and left with BLE elevated.         ADL Overall ADL's : Needs assistance/impaired     Grooming: Min guard;Standing Grooming Details (indicate cue type and reason): completed grooming in standing at sink with setup assist and then  min/steady assist for standing balance Upper Body Bathing: Min guard;Standing             Lower Body Dressing Details (indicate cue type and reason): Requires assist to don socks PTA, total assist for socks this session Toilet Transfer: Minimal assistance;BSC;RW           Functional mobility during ADLs: Minimal assistance;Rolling walker;Cueing for safety General ADL Comments: Pt required cues for hand placement with sit > stand and safety with RW when maneuvering obstacles in room.     Vision Vision Assessment?: No apparent visual deficits          Pertinent Vitals/Pain Pain Assessment: Faces Faces Pain Scale: Hurts a little bit     Hand Dominance Right   Extremity/Trunk Assessment Upper Extremity Assessment Upper Extremity Assessment: Generalized weakness   Lower Extremity Assessment Lower Extremity Assessment: Generalized weakness       Communication Communication Communication: No difficulties   Cognition Arousal/Alertness: Awake/alert Behavior During Therapy: WFL for tasks assessed/performed Overall Cognitive Status: Within Functional Limits for tasks assessed                Home Living Family/patient expects to be discharged to:: Skilled nursing facility Living Arrangements: Children Available Help at Discharge: Family;Available PRN/intermittently Type of Home: House Home Access: Level entry     Home Layout: One level               Home Equipment: None          Prior Functioning/Environment Level of Independence: Independent  Comments: Reports needing help with donning socks, his daughter would perform for him. Reports 3 falls recently.     OT Diagnosis: Generalized weakness;Acute pain   OT Problem List: Decreased strength;Decreased activity tolerance;Impaired balance (sitting and/or standing);Decreased safety awareness;Decreased knowledge of use of DME or AE;Pain   OT Treatment/Interventions: Self-care/ADL  training;Therapeutic exercise;Energy conservation;DME and/or AE instruction;Therapeutic activities;Patient/family education;Balance training    OT Goals(Current goals can be found in the care plan section) Acute Rehab OT Goals Patient Stated Goal: to go to rehab to get stronger OT Goal Formulation: With patient Time For Goal Achievement: 04/21/15 Potential to Achieve Goals: Good  OT Frequency: Min 2X/week    End of Session Equipment Utilized During Treatment: Gait belt;Rolling walker Nurse Communication: Mobility status  Activity Tolerance: Patient tolerated treatment well;No increased pain Patient left: in chair;with call bell/phone within reach;with chair alarm set   Time: 6948-5462 OT Time Calculation (min): 18 min Charges:  OT General Charges $OT Visit: 1 Procedure OT Evaluation $Initial OT Evaluation Tier I: 1 Procedure G-CodesSimonne Come, [id number] 04/07/2015, 11:54 AM

## 2015-04-07 NOTE — Clinical Social Work Note (Signed)
Clinical Social Work Assessment  Patient Details  Name: Jorge Malone MRN: [id number] Date of Birth: May 04, 1941  Date of referral:  04/07/15               Reason for consult:  Facility Placement                Permission sought to share information with:  Family Supports, Chartered certified accountant granted to share information::  Yes, Verbal Permission Granted  Name::     [facility]  Agency::  [facility]  Relationship::  daughter  Contact Information:     Housing/Transportation Living arrangements for the past 2 months:  Single Family Home Source of Information:  Patient Patient Interpreter Needed:  None Criminal Activity/Legal Involvement Pertinent to Current Situation/Hospitalization:    Significant Relationships:  Adult Children Lives with:  Adult Children Do you feel safe going back to the place where you live?  Yes Need for family participation in patient care:  Yes (Comment) (help with decision making)  Care giving concerns:  Pt lives with daughter but does not have sufficient assistance with current level of mobility   Facilities manager / plan:  CSW spoke with pt concerning PT recommendation for SNF  Employment status:  Retired Nurse, adult PT Recommendations:  [place] / Referral to community resources:  [place]  Patient/Family's Response to care:  Pt is agreeable to short term SNF stay given his current deficits- would like daughter input about which facility to go to.  Pt has just moved to [place] from [place]- he moved so that he would be with his daughter.  Daughter unable to provide 24 hour assistance due to work/ taking care of her two children  Patient/Family's Understanding of and Emotional Response to Diagnosis, Current Treatment, and Prognosis:  Pt reports clear understanding- no questions or concerns at this time.  Emotional Assessment Appearance:  Appears stated  age Attitude/Demeanor/Rapport:    Affect (typically observed):  Appropriate, Pleasant Orientation:  Oriented to Self, Oriented to Place, Oriented to  Time, Oriented to Situation Alcohol / Substance use:  Not Applicable Psych involvement (Current and /or in the community):  No (Comment)  Discharge Needs  Concerns to be addressed:  Care Coordination Readmission within the last 30 days:  No Current discharge risk:  Physical Impairment Barriers to Discharge:  Continued Medical Work up   [employer], LCSW 04/07/2015, 11:18 AM

## 2015-04-08 DIAGNOSIS — E872 Acidosis: Secondary | ICD-10-CM

## 2015-04-08 LAB — CBC
HCT: 35.5 % — ABNORMAL LOW (ref 39.0–52.0)
Hemoglobin: 11.4 g/dL — ABNORMAL LOW (ref 13.0–17.0)
MCH: 29 pg (ref 26.0–34.0)
MCHC: 32.1 g/dL (ref 30.0–36.0)
MCV: 90.3 fL (ref 78.0–100.0)
PLATELETS: 191 10*3/uL (ref 150–400)
RBC: 3.93 MIL/uL — ABNORMAL LOW (ref 4.22–5.81)
RDW: 15.2 % (ref 11.5–15.5)
WBC: 13.7 10*3/uL — ABNORMAL HIGH (ref 4.0–10.5)

## 2015-04-08 LAB — HEPARIN LEVEL (UNFRACTIONATED): HEPARIN UNFRACTIONATED: 0.48 [IU]/mL (ref 0.30–0.70)

## 2015-04-08 LAB — URINE CULTURE: Culture: NO GROWTH

## 2015-04-08 LAB — PROCALCITONIN: Procalcitonin: <0.1 ng/mL

## 2015-04-08 LAB — GLUCOSE, CAPILLARY
Glucose-Capillary: 151 mg/dL — ABNORMAL HIGH (ref 65–99)
Glucose-Capillary: 137 mg/dL — ABNORMAL HIGH (ref 65–99)

## 2015-04-08 MED ORDER — APIXABAN 5 MG PO TABS: 10.0000 mg | ORAL_TABLET | Freq: Two times a day (BID) | ORAL | Status: DC

## 2015-04-08 MED ORDER — HYDROCODONE-ACETAMINOPHEN 5-325 MG PO TABS: 1.0000 | ORAL_TABLET | ORAL | Status: DC | PRN

## 2015-04-08 MED ORDER — APIXABAN 5 MG PO TABS
5.0000 mg | ORAL_TABLET | Freq: Two times a day (BID) | ORAL | Status: DC
Start: 2015-04-15 — End: 2015-04-08

## 2015-04-08 MED ORDER — PREDNISONE 20 MG PO TABS: 40.0000 mg | ORAL_TABLET | Freq: Every day | ORAL | Status: AC

## 2015-04-08 MED ORDER — APIXABAN 5 MG PO TABS
10.0000 mg | ORAL_TABLET | Freq: Two times a day (BID) | ORAL | Status: DC
Start: 2015-04-08 — End: 2015-04-08
  Administered 2015-04-08: 10 mg via ORAL
  Filled 2015-04-08: qty 2

## 2015-04-08 MED ORDER — LEVOFLOXACIN 500 MG PO TABS: 500.0000 mg | ORAL_TABLET | Freq: Every day | ORAL | Status: DC

## 2015-04-08 NOTE — Discharge Summary (Signed)
Physician Discharge Summary  Jorge Malone JGG:[id number] DOB: [birth year] DOA: 04/04/2015  PCP: Lance Bosch, NP  Admit date: 04/04/2015 Discharge date: 04/08/2015  Time spent: > 30 minutes  Recommendations for Outpatient Follow-up:  1. Follow up with Jorge Malone in 1-2 weeks 2. Follow up with Dr. [name] in 2-3 weeks 3. Prednisone 40 mg daily for 3 days then return to 20 mg daily 4. Levaquin for 3 additional days 5. Patient started on Eliquis for new PE   Discharge Diagnoses:  Principal Problem:   Sepsis ([place]) Active Problems:   Rheumatoid arthritis ([place])   DM2 (diabetes mellitus, type 2) (HCC)   CAD (coronary artery disease)   Hyperlipidemia   HTN (hypertension)   Abdominal pain   Right knee pain   Lactic acidosis   Fall   Acute pulmonary embolism (HCC)   Knee effusion   Mass of urinary bladder   Tobacco abuse  Discharge Condition: stable  Diet recommendation: heart healthy  Filed Weights   04/04/15 0509 04/04/15 0848  Weight: 104.327 kg (230 lb) 102.331 kg (225 lb 9.6 oz)    History of present illness:  See H&P, Labs, Consult and Test reports for all details in brief, patient is a 74 year old male with diabetes mellitus, rheumatoid arthritis, hyperlipidemia, coronary disease, hypertension presented to ED after a fall, diffuse body aches, right knee pain. He appeared septic on admission and was started on broad spectrum antibiotics.   Hospital Course:  SIRS [facility]) of unclear etiology - Patient met SIRS criteria with fever, tachycardia, hypoxia, lactic acidosis, leukocytosis,  lactic acid normalized, influenza negative, blood cultures negative to date, urinalysis without evidence of UTI, no source found, he was initially on vancomycin, aztreonam and Levaquin, he was placed on Levaquin alone, will complete an empiric course with 3 additional days.  Subacute vs chronic PE - on right upper lobe pulmonary artery, right middle lobe pulmonary artery and branches of the  right lower lobe pulmonary. This was not present on his CT angio in August 2016. Transitioned to Eliquis Chest pain - likely due to PE, troponin minimally elevated, flat, EKG without acute findings, treat underlying PE Urinary bladder mass - 4.0 x 6.3 x 5.6 cm mass involving the right posterolateral wall of the urinary bladder at its base. Patient with long standing history of smoking, urology consulted, plan for TURBT as an outpatient  Right knee effusion - patient endorses a history of gout however denies knee swelling or aspirations in the past, knee tapped 11/5, appreciate ortho help, appears like arthritic flare up without infection or gout findings. CAD (coronary artery disease) - Currently stable, continue beta blocker. Holding HCTZ, lisinopril due to borderline hypotension  Chronic diastolic heart failure - euvolemic on exam Rheumatoid arthritis (HCC) - Iv steroids on 11/6 with improvement in his generalized pain, continue prednisone 40 mg daily for 3 additional days then return to 20 mg daily.  DM2 (diabetes mellitus, type 2) ([name]), uncontrolled - resume home medications Hyperlipidemia - On statin, [facility] HTN (hypertension) - continue home regimen  Pleuritic right sided chest pain and right sided abdominal pain - LFTs normal, lipase minimally elevated, suspect pain related to PE COPD - no wheezing, no apparent exacerbation, continue home medications Fall - PTOT evaluation, SNF discharge Tobacco abuse - counseled for cessation   Procedures:  None    Consultations:  Orthopedic surgery   Discharge Exam: Filed Vitals:   04/07/15 0526 04/07/15 1431 04/07/15 2131 04/08/15 0620  BP: 132/78 130/88 138/75 133/69  Pulse: 73 87 78 60  Temp: 98.6 F (37 C) 98.7 F (37.1 C) 97.9 F (36.6 C) 98.2 F (36.8 C)  TempSrc: Oral Oral Oral Oral  Resp: 18 18 20 18   Height:      Weight:      SpO2: 91% 98% 95% 96%    General: NAD Cardiovascular: RRR Respiratory: CTA biL  Discharge  Instructions Activity:  As tolerated   Get Medicines reviewed and adjusted: Please take all your medications with you for your next visit with your Primary MD  Please request your Primary MD to go over all hospital tests and procedure/radiological results at the follow up, please ask your Primary MD to get all Hospital records sent to his/her office.  If you experience worsening of your admission symptoms, develop shortness of breath, life threatening emergency, suicidal or homicidal thoughts you must seek medical attention immediately by calling 911 or calling your MD immediately if symptoms less severe.  You must read complete instructions/literature along with all the possible adverse reactions/side effects for all the Medicines you take and that have been prescribed to you. Take any new Medicines after you have completely understood and accpet all the possible adverse reactions/side effects.   Do not drive when taking Pain medications.   Do not take more than prescribed Pain, Sleep and Anxiety Medications  Special Instructions: If you have smoked or chewed Tobacco in the last 2 yrs please stop smoking, stop any regular Alcohol and or any Recreational drug use.  Wear Seat belts while driving.  Please note  You were cared for by a hospitalist during your hospital stay. Once you are discharged, your primary care physician will handle any further medical issues. Please note that NO REFILLS for any discharge medications will be authorized once you are discharged, as it is imperative that you return to your primary care physician (or establish a relationship with a primary care physician if you do not have one) for your aftercare needs so that they can reassess your need for medications and monitor your lab values.    Medication List    STOP taking these medications        guaiFENesin 600 MG 12 hr tablet  Commonly known as:  MUCINEX     traMADol 50 MG tablet  Commonly known as:   ULTRAM      TAKE these medications        apixaban 5 MG Tabs tablet  Commonly known as:  ELIQUIS  Take 2 tablets (10 mg total) by mouth 2 (two) times daily. For 7 days, then 5 mg twice daily.     budesonide-formoterol 160-4.5 MCG/ACT inhaler  Commonly known as:  SYMBICORT  Inhale 2 puffs into the lungs 2 (two) times daily.     colchicine 0.6 MG tablet  Take 0.6 mg by mouth 2 (two) times daily as needed.     ENBREL SURECLICK 50 MG/ML injection  Generic drug:  etanercept  Inject 50 mg into the skin once a week. On Sundays     fenofibrate 48 MG tablet  Commonly known as:  TRICOR  Take 48 mg by mouth daily.     folic acid 1 MG tablet  Commonly known as:  FOLVITE  Take 1 tablet (1 mg total) by mouth daily.     glucose blood test strip  Commonly known as:  TRUETEST TEST  Check sugars twice per day for E11.9     glucose blood test strip  Use as instructed  hydrochlorothiazide 25 MG tablet  Commonly known as:  HYDRODIURIL  Take 1 tablet (25 mg total) by mouth daily.     HYDROcodone-acetaminophen 5-325 MG tablet  Commonly known as:  NORCO/VICODIN  Take 1-2 tablets by mouth every 4 (four) hours as needed for moderate pain.     levalbuterol 0.63 MG/3ML nebulizer solution  Commonly known as:  XOPENEX  Take 3 mLs (0.63 mg total) by nebulization every 6 (six) hours as needed for wheezing or shortness of breath. Use 3 times daily x 4 days, then every 6 hours as needed for SOB.     levofloxacin 500 MG tablet  Commonly known as:  LEVAQUIN  Take 1 tablet (500 mg total) by mouth daily. For 3 more days     lisinopril 2.5 MG tablet  Commonly known as:  PRINIVIL,ZESTRIL  Take 1 tablet (2.5 mg total) by mouth daily.     metFORMIN 1000 MG tablet  Commonly known as:  GLUCOPHAGE  TAKE 1 TABLET (1,000 MG TOTAL) BY MOUTH DAILY WITH BREAKFAST.     metoprolol tartrate 25 MG tablet  Commonly known as:  LOPRESSOR  Take 1 tablet (25 mg total) by mouth daily.     multivitamin tablet    Take 1 tablet by mouth daily.     ONE TOUCH BASIC SYSTEM W/DEVICE Kit  TID and QHS     predniSONE 20 MG tablet  Commonly known as:  DELTASONE  Take 2 tablets (40 mg total) by mouth daily. For 3 days then return to 20 mg daily     rosuvastatin 5 MG tablet  Commonly known as:  CRESTOR  Take 5 mg by mouth daily.     tiotropium 18 MCG inhalation capsule  Commonly known as:  SPIRIVA HANDIHALER  Place 1 capsule (18 mcg total) into inhaler and inhale daily.           Follow-up Information    Follow up with [facility] On 04/10/2015.   Why:  [facility] appointment on 04/10/15 at 11:30 am with Dr. Jarold Song.   Contact information:   [address] [place] [address] [phone]      Follow up with Festus Aloe, MD.   Specialty:  Urology   Why:  2 - 3 weeks    Contact information:   [place] [address] [phone]       Follow up with Lance Bosch, NP. Schedule an appointment as soon as possible for a visit in 1 week.   Specialty:  Internal Medicine   Contact information:   [place] [address] [phone]       The results of significant diagnostics from this hospitalization (including imaging, microbiology, ancillary and laboratory) are listed below for reference.    Significant Diagnostic Studies: Ct Angio Chest Pe W/cm &/or Wo Cm  04/04/2015  CLINICAL DATA:  74 year old who fell at home while walking to the bathroom after getting out of bed earlier today. 3-4 day history of right pleuritic chest pain, cough, and right upper quadrant abdominal pain. Laboratory data demonstrates leukocytosis [place] [address] and and elevated lipase of 57. EXAM: CT ANGIOGRAPHY CHEST CT ABDOMEN AND PELVIS WITH CONTRAST TECHNIQUE: Multidetector CT imaging of the chest was performed using the standard protocol during bolus administration of intravenous contrast.  Multiplanar CT image reconstructions and MIPs were obtained to evaluate the vascular anatomy. Multidetector CT imaging of the abdomen and  pelvis was performed using the standard protocol during bolus administration of intravenous contrast. CONTRAST:  121m OMNIPAQUE IOHEXOL 350 MG/ML IV. COMPARISON:  CTA chest abdomen and pelvis 01/03/2015. FINDINGS: CTA CHEST FINDINGS Contrast opacification of pulmonary arteries is good. Respiratory motion blurs many of the images throughout the chest. Overall, the study is of moderate to good diagnostic quality. Filling defects within branches of the right upper lobe pulmonary artery, though the defects are peripheral and along the vessel wall. Similar peripheral filling defects in the right middle lobe pulmonary artery and a lateral segmental branch of the right lower lobe pulmonary artery. No definite filling defects in the branches of the left pulmonary artery. RV/LV ratio = 1.12. Heart enlarged with evidence of left ventricular hypertrophy. Prominent epicardial fat. Moderate LAD coronary atherosclerosis. No pericardial effusion. Mild atherosclerosis involving the thoracic aorta without aneurysm or dissection. Mild peripheral interstitial fibrosis involving the right middle lobe and both lower lobes, unchanged. Atelectasis in the lower lobes. Small bilateral pleural effusions. No confluent airspace consolidation. Scattered small bilateral pleural plaques, noncalcified, unchanged. Numerous normal-sized and upper normal sized mediastinal lymph nodes, the largest a right lower mediastinal (station 8) nodal mass measuring approximately 2.5 x 2.8 cm (previously 2.0 x 2.6 cm. AP window (station 5) lymph nodes have also increased in size since the prior examination. Station 4R and 4L lymph nodes have also increased in size. An enlarged right hilar lymph node measures approximately 2.6 x 2.2 cm, unchanged. Left hilar lymph nodes are normal in size but have increased since the prior  examination. No axillary lymphadenopathy. Extensive mucous plugging within the trachea, both mainstem bronchi, with extension into the right upper lobe and left lower lobe bronchi. 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 Generalized osseous demineralization. Review of the MIP images confirms the above findings. CT ABDOMEN and PELVIS FINDINGS Liver normal in size and appearance. Gallbladder normal in appearance without calcified gallstones. No biliary ductal dilation. Pancreas normal in appearance without evidence of mass, ductal dilation, or inflammation. Spleen normal in size and appearance. Adrenal glands normal in appearance. Bilateral renal sinus lipomatosis. Cortical cysts involving both kidneys. Nonobstructing small bilateral renal calculi. No hydronephrosis. No solid renal masses. Approximate 4.0 x 6.3 x 5.6 cm mass involving the right posterolateral wall of the urinary bladder at its base. Stomach normal in appearance for the degree of distention. Normal-appearing small bowel. Distal descending and sigmoid colon diverticulosis without evidence of acute diverticulitis. Remainder of the colon unremarkable with moderate stool burden. Normal-appearing appendix without evidence of inflammation. No ascites. Severe aortoiliofemoral atherosclerosis without aneurysm. Visceral arteries patent though atherosclerotic. No pathologic lymphadenopathy. Moderate prostate gland enlargement. Normal-appearing seminal vesicles. Penile prosthesis with the inflation device in the right anterior low pelvis adjacent to the urinary bladder. Bone window images demonstrate severe degenerative changes in the left hip, moderate degenerative changes in the right hip, ankylosis of the sacroiliac joints, and multilevel degenerative disc disease, spondylosis and facet degenerative changes throughout the lumbar spine. IMPRESSION: 1. Findings consistent with subacute or chronic pulmonary emboli involving  the right upper lobe pulmonary artery, right middle lobe pulmonary artery and branches of the right lower lobe pulmonary artery. 2. Atelectasis involving the lower lobes. Small bilateral pleural effusions. 3. Enlarging mediastinal lymph nodes. Index nodes are measured above. 4. Extensive mucous plugging involving the trachea, bilateral mainstem bronchi, with extension into the right upper lobe and left lower lobe bronchi. 5. Large mass arising from the right posterolateral wall of the urinary bladder at  its base. Measurements given above. 6. Distal descending and sigmoid colon diverticulosis without evidence of acute diverticulitis. 7. Osseous findings as above.  No acute osseous abnormalities. Electronically Signed   By: Evangeline Dakin M.D.   On: 04/04/2015 12:16   Ct Abdomen Pelvis W Contrast  04/04/2015  CLINICAL DATA:  74 year old who fell at home while walking to the bathroom after getting out of bed earlier today. 3-4 day history of right pleuritic chest pain, cough, and right upper quadrant abdominal pain. Laboratory data demonstrates leukocytosis with a [place] blood [place] of [address] and and elevated lipase of 57. EXAM: CT ANGIOGRAPHY CHEST CT ABDOMEN AND PELVIS WITH CONTRAST TECHNIQUE: Multidetector CT imaging of the chest was performed using the standard protocol during bolus administration of intravenous contrast. Multiplanar CT image reconstructions and MIPs were obtained to evaluate the vascular anatomy. Multidetector CT imaging of the abdomen and pelvis was performed using the standard protocol during bolus administration of intravenous contrast. CONTRAST:  120m OMNIPAQUE IOHEXOL 350 MG/ML IV. COMPARISON:  CTA chest abdomen and pelvis 01/03/2015. FINDINGS: CTA CHEST FINDINGS Contrast opacification of pulmonary arteries is good. Respiratory motion blurs many of the images throughout the chest. Overall, the study is of moderate to good diagnostic quality. Filling defects within branches of the right  upper lobe pulmonary artery, though the defects are peripheral and along the vessel wall. Similar peripheral filling defects in the right middle lobe pulmonary artery and a lateral segmental branch of the right lower lobe pulmonary artery. No definite filling defects in the branches of the left pulmonary artery. RV/LV ratio = 1.12. Heart enlarged with evidence of left ventricular hypertrophy. Prominent epicardial fat. Moderate LAD coronary atherosclerosis. No pericardial effusion. Mild atherosclerosis involving the thoracic aorta without aneurysm or dissection. Mild peripheral interstitial fibrosis involving the right middle lobe and both lower lobes, unchanged. Atelectasis in the lower lobes. Small bilateral pleural effusions. No confluent airspace consolidation. Scattered small bilateral pleural plaques, noncalcified, unchanged. Numerous normal-sized and upper normal sized mediastinal lymph nodes, the largest a right lower mediastinal (station 8) nodal mass measuring approximately 2.5 x 2.8 cm (previously 2.0 x 2.6 cm. AP window (station 5) lymph nodes have also increased in size since the prior examination. Station 4R and 4L lymph nodes have also increased in size. An enlarged right hilar lymph node measures approximately 2.6 x 2.2 cm, unchanged. Left hilar lymph nodes are normal in size but have increased since the prior examination. No axillary lymphadenopathy. Extensive mucous plugging within the trachea, both mainstem bronchi, with extension into the right upper lobe and left lower lobe bronchi. 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 Generalized osseous demineralization. Review of the MIP images confirms the above findings. CT ABDOMEN and PELVIS FINDINGS Liver normal in size and appearance. Gallbladder normal in appearance without calcified gallstones. No biliary ductal dilation. Pancreas normal in appearance without evidence of mass, ductal dilation, or inflammation.  Spleen normal in size and appearance. Adrenal glands normal in appearance. Bilateral renal sinus lipomatosis. Cortical cysts involving both kidneys. Nonobstructing small bilateral renal calculi. No hydronephrosis. No solid renal masses. Approximate 4.0 x 6.3 x 5.6 cm mass involving the right posterolateral wall of the urinary bladder at its base. Stomach normal in appearance for the degree of distention. Normal-appearing small bowel. Distal descending and sigmoid colon diverticulosis without evidence of acute diverticulitis. Remainder of the colon unremarkable with moderate stool burden. Normal-appearing appendix without evidence of inflammation. No ascites. Severe aortoiliofemoral atherosclerosis without aneurysm. Visceral arteries patent though  atherosclerotic. No pathologic lymphadenopathy. Moderate prostate gland enlargement. Normal-appearing seminal vesicles. Penile prosthesis with the inflation device in the right anterior low pelvis adjacent to the urinary bladder. Bone window images demonstrate severe degenerative changes in the left hip, moderate degenerative changes in the right hip, ankylosis of the sacroiliac joints, and multilevel degenerative disc disease, spondylosis and facet degenerative changes throughout the lumbar spine. IMPRESSION: 1. Findings consistent with subacute or chronic pulmonary emboli involving the right upper lobe pulmonary artery, right middle lobe pulmonary artery and branches of the right lower lobe pulmonary artery. 2. Atelectasis involving the lower lobes. Small bilateral pleural effusions. 3. Enlarging mediastinal lymph nodes. Index nodes are measured above. 4. Extensive mucous plugging involving the trachea, bilateral mainstem bronchi, with extension into the right upper lobe and left lower lobe bronchi. 5. Large mass arising from the right posterolateral wall of the urinary bladder at its base. Measurements given above. 6. Distal descending and sigmoid colon diverticulosis  without evidence of acute diverticulitis. 7. Osseous findings as above.  No acute osseous abnormalities. Electronically Signed   By: Evangeline Dakin M.D.   On: 04/04/2015 12:16   Dg Chest Port 1 View  04/04/2015  CLINICAL DATA:  Code sepsis. Tachycardia. History COPD, diabetes, coronary artery disease and hypertension. EXAM: PORTABLE CHEST 1 VIEW COMPARISON:  Chest radiograph January 02, 2015 FINDINGS: Cardiac silhouette is mildly enlarged unchanged. Mediastinal silhouette is nonsuspicious. Strandy densities in lung bases, no pleural effusion or focal consolidation. Bilateral midlung zone scarring. No pneumothorax. Soft tissue planes and included osseous structure nonsuspicious. IMPRESSION: Stable cardiomegaly.  Bibasilar atelectasis. Electronically Signed   By: Elon Alas M.D.   On: 04/04/2015 05:44   Dg Knee Complete 4 Views Right  04/04/2015  CLINICAL DATA:  Acute anterior right knee pain for 4 weeks with swelling. EXAM: RIGHT KNEE - COMPLETE 4+ VIEW COMPARISON:  None. FINDINGS: Diffuse soft tissue swelling/subcutaneous edema evident. Normal alignment without acute osseous finding or fracture. Moderately large joint effusion noted on the lateral view. IMPRESSION: Soft tissue swelling and right knee joint effusion. No acute osseous finding. Electronically Signed   By: Jerilynn Mages.  Shick M.D.   On: 04/04/2015 08:14    Microbiology: Recent Results (from the past 240 hour(s))  Culture, blood (routine x 2)     Status: None (Preliminary result)   Collection Time: 04/04/15  5:15 AM  Result Value Ref Range Status   Specimen Description BLOOD LEFT ANTECUBITAL  Final   Special Requests BOTTLES DRAWN AEROBIC AND ANAEROBIC 5CC  Final   Culture NO GROWTH 3 DAYS  Final   Report Status PENDING  Incomplete  Culture, blood (routine x 2)     Status: None (Preliminary result)   Collection Time: 04/04/15  5:25 AM  Result Value Ref Range Status   Specimen Description BLOOD RIGHT ANTECUBITAL  Final   Special  Requests BOTTLES DRAWN AEROBIC AND ANAEROBIC 5CC  Final   Culture NO GROWTH 3 DAYS  Final   Report Status PENDING  Incomplete  Urine culture     Status: None   Collection Time: 04/04/15 12:29 PM  Result Value Ref Range Status   Specimen Description URINE, RANDOM  Final   Special Requests NONE  Final   Culture NO GROWTH 1 DAY  Final   Report Status 04/05/2015 FINAL  Final  Gram stain     Status: None   Collection Time: 04/05/15 10:23 AM  Result Value Ref Range Status   Specimen Description FLUID SYNOVIAL RIGHT KNEE  Final  Special Requests Normal  Final   Gram Stain   Final    ABUNDANT WBC PRESENT,BOTH PMN AND MONONUCLEAR NO ORGANISMS SEEN    Report Status 04/05/2015 FINAL  Final  Culture, body fluid-bottle     Status: None (Preliminary result)   Collection Time: 04/05/15 10:23 AM  Result Value Ref Range Status   Specimen Description FLUID SYNOVIAL RIGHT KNEE  Final   Special Requests NONE  Final   Culture NO GROWTH 2 DAYS  Final   Report Status PENDING  Incomplete  Urine culture     Status: None (Preliminary result)   Collection Time: 04/07/15  6:44 PM  Result Value Ref Range Status   Specimen Description URINE, RANDOM  Final   Special Requests NONE  Final   Culture NO GROWTH < 24 HOURS  Final   Report Status PENDING  Incomplete     Labs: Basic Metabolic Panel:  Recent Labs Lab 04/04/15 0515 04/04/15 0531 04/05/15 0123  NA 138 138 138  K 4.1 3.9 3.8  CL 101 102 108  CO2 23  --  23  GLUCOSE 147* 146* 108*  BUN 13 15 8   CREATININE 1.05 1.00 0.72  CALCIUM 9.5  --  8.4*   Liver Function Tests:  Recent Labs Lab 04/04/15 0515  AST 21  ALT 15*  ALKPHOS 55  BILITOT 1.1  PROT 6.9  ALBUMIN 3.1*    Recent Labs Lab 04/04/15 0515  LIPASE 57*   CBC:  Recent Labs Lab 04/04/15 0515 04/04/15 0531 04/05/15 0123 04/06/15 0516 04/07/15 0554 04/08/15 0500  WBC 13.8*  --  9.3 12.5* 11.0* 13.7*  NEUTROABS 11.1*  --   --   --   --   --   HGB 14.1 15.6 10.9*  12.1* 11.3* 11.4*  HCT 43.5 46.0 34.3* 36.8* 35.3* 35.5*  MCV 92.0  --  91.7 92.2 90.7 90.3  PLT 205  --  159 204 183 191   Cardiac Enzymes:  Recent Labs Lab 04/04/15 0816 04/04/15 1345 04/04/15 1930 04/06/15 0516 04/06/15 1130 04/06/15 1608  CKTOTAL 20* 23* 26*  --   --   --   CKMB 0.8 1.4 1.4  --   --   --   TROPONINI 0.03 <0.03 <0.03 0.09* 0.04* <0.03    CBG:  Recent Labs Lab 04/07/15 0811 04/07/15 1215 04/07/15 1707 04/07/15 2207 04/08/15 0756  GLUCAP 152* 118* 139* 215* 151*       Signed:  Ondre Salvetti  [facility] 04/08/2015, 9:53 AM

## 2015-04-08 NOTE — Discharge Instructions (Addendum)
Information on my medicine - ELIQUIS (apixaban)  This medication education was reviewed with me or my healthcare representative as part of my discharge preparation.    Why was Eliquis prescribed for you? Eliquis was prescribed to treat blood clots that may have been found in the veins of your legs (deep vein thrombosis) or in your lungs (pulmonary embolism) and to reduce the risk of them occurring again.  What do You need to know about Eliquis ? The starting dose is 10 mg (two 5 mg tablets) taken TWICE daily for the FIRST SEVEN (7) DAYS, then on 04/15/15  the dose is reduced to ONE 5 mg tablet taken TWICE daily.  Eliquis may be taken with or without food.   Try to take the dose about the same time in the morning and in the evening. If you have difficulty swallowing the tablet whole please discuss with your pharmacist how to take the medication safely.  Take Eliquis exactly as prescribed and DO NOT stop taking Eliquis without talking to the doctor who prescribed the medication.  Stopping may increase your risk of developing a new blood clot.  Refill your prescription before you run out.  After discharge, you should have regular check-up appointments with your healthcare provider that is prescribing your Eliquis.    What do you do if you miss a dose? If a dose of ELIQUIS is not taken at the scheduled time, take it as soon as possible on the same day and twice-daily administration should be resumed. The dose should not be doubled to make up for a missed dose.  Important Safety Information A possible side effect of Eliquis is bleeding. You should call your healthcare provider right away if you experience any of the following: ? Bleeding from an injury or your nose that does not stop. ? Unusual colored urine (red or dark brown) or unusual colored stools (red or black). ? Unusual bruising for unknown reasons. ? A serious fall or if you hit your head (even if there is no bleeding).  Some  medicines may interact with Eliquis and might increase your risk of bleeding or clotting while on Eliquis. To help avoid this, consult your healthcare provider or pharmacist prior to using any new prescription or non-prescription medications, including herbals, vitamins, non-steroidal anti-inflammatory drugs (NSAIDs) and supplements.  This website has more information on Eliquis (apixaban): http://www.eliquis.com/eliquis/home

## 2015-04-08 NOTE — Progress Notes (Signed)
Physical Therapy Treatment Patient Details Name: Jorge Malone MRN: [id number] DOB: 1940/10/17 Today's Date: 2015/04/14    History of Present Illness Patient is a 74 y/o male presents s/p fall, body aches and right knee pain admitted with sepsis. CXR showed atelectasis with cardiomegaly. Knee xray-Soft tissue swelling and right knee joint effusion.PMH includes RA, DM, COPD, HTN, HLD, Ca.    PT Comments    Pt making good progress. Continue to recommend ST-SNF prior to return home.  Follow Up Recommendations  SNF     Equipment Recommendations  Other (comment) (defer to next venue)    Recommendations for Other Services       Precautions / Restrictions Precautions Precautions: Fall Restrictions Weight Bearing Restrictions: No    Mobility  Bed Mobility                  Transfers Overall transfer level: Needs assistance Equipment used: None Transfers: Sit to/from Stand Sit to Stand: Min guard         General transfer comment: Assist for balance.   Ambulation/Gait Ambulation/Gait assistance: Min assist Ambulation Distance (Feet): 200 Feet Assistive device: None (wall rail at times) Gait Pattern/deviations: Step-through pattern;Decreased stride length;Drifts right/left Gait velocity: decr Gait velocity interpretation: Below normal speed for age/gender General Gait Details: Pt wanted to try without assistive device. Slightly unsteady and requiring assist for balance. Used wall rail intermittently.   Stairs            Wheelchair Mobility    Modified Rankin (Stroke Patients Only)       Balance Overall balance assessment: History of Falls Sitting-balance support: No upper extremity supported;Feet supported Sitting balance-Leahy Scale: Good     Standing balance support: No upper extremity supported Standing balance-Leahy Scale: Fair                      Cognition Arousal/Alertness: Awake/alert Behavior During Therapy: WFL for tasks  assessed/performed Overall Cognitive Status: Within Functional Limits for tasks assessed                      Exercises      General Comments        Pertinent Vitals/Pain      Home Living                      Prior Function            PT Goals (current goals can now be found in the care plan section) Progress towards PT goals: Progressing toward goals    Frequency  Min 2X/week    PT Plan Current plan remains appropriate    Co-evaluation             End of Session Equipment Utilized During Treatment: Gait belt Activity Tolerance: Patient tolerated treatment well Patient left: in chair;with call bell/phone within reach;with chair alarm set     Time: 7829-5621 PT Time Calculation (min) (ACUTE ONLY): 8 min  Charges:  $Gait Training: 8-22 mins                    G Codes:      Lyman Balingit 04/14/15, 12:11 PM [employer] PT [phone]

## 2015-04-08 NOTE — Progress Notes (Signed)
Nsg Discharge Note  Admit Date:  04/04/2015 Discharge date: 04/08/2015   Shane Crutch to be D/C'd Skilled nursing facility per MD order.  AVS completed.  Copy for chart, and copy for patient signed, and dated. Patient/caregiver able to verbalize understanding.  Discharge Medication:   Medication List    STOP taking these medications        guaiFENesin 600 MG 12 hr tablet  Commonly known as:  MUCINEX     traMADol 50 MG tablet  Commonly known as:  ULTRAM      TAKE these medications        apixaban 5 MG Tabs tablet  Commonly known as:  ELIQUIS  Take 2 tablets (10 mg total) by mouth 2 (two) times daily. For 7 days, then 5 mg twice daily.     budesonide-formoterol 160-4.5 MCG/ACT inhaler  Commonly known as:  SYMBICORT  Inhale 2 puffs into the lungs 2 (two) times daily.     colchicine 0.6 MG tablet  Take 0.6 mg by mouth 2 (two) times daily as needed.     ENBREL SURECLICK 50 MG/ML injection  Generic drug:  etanercept  Inject 50 mg into the skin once a week. On Sundays     fenofibrate 48 MG tablet  Commonly known as:  TRICOR  Take 48 mg by mouth daily.     folic acid 1 MG tablet  Commonly known as:  FOLVITE  Take 1 tablet (1 mg total) by mouth daily.     glucose blood test strip  Commonly known as:  TRUETEST TEST  Check sugars twice per day for E11.9     glucose blood test strip  Use as instructed     hydrochlorothiazide 25 MG tablet  Commonly known as:  HYDRODIURIL  Take 1 tablet (25 mg total) by mouth daily.     HYDROcodone-acetaminophen 5-325 MG tablet  Commonly known as:  NORCO/VICODIN  Take 1-2 tablets by mouth every 4 (four) hours as needed for moderate pain.     levalbuterol 0.63 MG/3ML nebulizer solution  Commonly known as:  XOPENEX  Take 3 mLs (0.63 mg total) by nebulization every 6 (six) hours as needed for wheezing or shortness of breath. Use 3 times daily x 4 days, then every 6 hours as needed for SOB.     levofloxacin 500 MG tablet  Commonly  known as:  LEVAQUIN  Take 1 tablet (500 mg total) by mouth daily. For 3 more days     lisinopril 2.5 MG tablet  Commonly known as:  PRINIVIL,ZESTRIL  Take 1 tablet (2.5 mg total) by mouth daily.     metFORMIN 1000 MG tablet  Commonly known as:  GLUCOPHAGE  TAKE 1 TABLET (1,000 MG TOTAL) BY MOUTH DAILY WITH BREAKFAST.     metoprolol tartrate 25 MG tablet  Commonly known as:  LOPRESSOR  Take 1 tablet (25 mg total) by mouth daily.     multivitamin tablet  Take 1 tablet by mouth daily.     ONE TOUCH BASIC SYSTEM W/DEVICE Kit  TID and QHS     predniSONE 20 MG tablet  Commonly known as:  DELTASONE  Take 2 tablets (40 mg total) by mouth daily. For 3 days then return to 20 mg daily     rosuvastatin 5 MG tablet  Commonly known as:  CRESTOR  Take 5 mg by mouth daily.     tiotropium 18 MCG inhalation capsule  Commonly known as:  SPIRIVA HANDIHALER  Place 1 capsule (18  mcg total) into inhaler and inhale daily.        Discharge Assessment: Filed Vitals:   04/08/15 0954  BP: 134/80  Pulse: 83  Temp:   Resp:    Skin clean, dry and intact without evidence of skin break down, no evidence of skin tears noted. IV catheter discontinued intact. Site without signs and symptoms of complications - no redness or edema noted at insertion site, patient denies c/o pain - only slight tenderness at site.  Dressing with slight pressure applied.  D/c Instructions-Education: Discharge packet sent to SNF. Patient transported via [facility] to SNF  Deklan Minar Margaretha Sheffield, RN 04/08/2015 12:35 PM

## 2015-04-08 NOTE — Care Management Note (Signed)
Case Management Note  Patient Details  Name: Jorge Malone MRN: [id number] Date of Birth: [birth year]  Subjective/Objective:      Patient is for dc today, to snf. CSW following.             Action/Plan:   Expected Discharge Date:                  Expected Discharge Plan:  Skilled Nursing Facility  In-House Referral:  Clinical Social Work  Discharge planning Services  CM Consult  Post Acute Care Choice:    Choice offered to:     DME Arranged:    DME Agency:     HH Arranged:    [place] Agency:     Status of Service:  Completed, signed off  Medicare Important Message Given:  Yes-second notification given Date Medicare IM Given:    Medicare IM give by:    Date Additional Medicare IM Given:    Additional Medicare Important Message give by:     If discussed at [place] of Stay Meetings, dates discussed:    Additional Comments:  Zenon Mayo, RN 04/08/2015, 11:30 AM

## 2015-04-08 NOTE — Progress Notes (Signed)
ANTICOAGULATION CONSULT NOTE - Follow Up Consult  Pharmacy Consult for heparin >> apixaban Indication: pulmonary embolus  Allergies  Allergen Reactions  . Amoxicillin Nausea Only and Other (See Comments)    GI problems Pt tolerates cephalosporins    Patient Measurements: Height: 5\' 9"  (175.3 cm) Weight: 225 lb 9.6 oz (102.331 kg) IBW/kg (Calculated) : 70.7 Heparin Dosing Weight: ~92 kg  Vital Signs: Temp: 98.2 F (36.8 C) (11/08 0620) Temp Source: Oral (11/08 0620) BP: 133/69 mmHg (11/08 0620) Pulse Rate: 60 (11/08 0620)  Labs:  Recent Labs  04/06/15 0516 04/06/15 1130 04/06/15 1608 04/07/15 0554 04/08/15 0500  HGB 12.1*  --   --  11.3* 11.4*  HCT 36.8*  --   --  35.3* 35.5*  PLT 204  --   --  183 191  HEPARINUNFRC 0.58  --   --  0.58 0.48  TROPONINI 0.09* 0.04* <0.03  --   --     Estimated Creatinine Clearance: 95.4 mL/min (by C-G formula based on Cr of 0.72).   Medications:  Infusions:  . 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 No s/sx of bleeding noted, CBC is stable.    Pharmacy asked to start Apixaban today for oral anticoagulation.  Heparin will be discontinued at time of first apixaban dose.  Goal of Therapy:  Therapeutic Anticoagulation Monitor platelets by anticoagulation protocol: Yes   Plan:  Discontinue heparin and heparin levels Start apixaban 10mg  PO BID x 7 days, then reduce to apixaban 5mg  BID therafter Monitor for s/sx of bleeding  [employer], Pharm.D., BCPS Clinical Pharmacist Pager [phone] 04/08/2015 8:44 AM

## 2015-04-08 NOTE — Progress Notes (Signed)
Patient will discharge to [facility] Anticipated discharge date:04/08/15 Family notified: daughter- Marchelle Folks by [employer]- scheduled at 2pm  [place] signing off.  Domenica Reamer, [place] Social Worker [phone]

## 2015-04-09 ENCOUNTER — Other Ambulatory Visit: Payer: Self-pay

## 2015-04-09 LAB — CULTURE, BLOOD (ROUTINE X 2)
CULTURE: NO GROWTH
Culture: NO GROWTH

## 2015-04-09 MED ORDER — LISINOPRIL 2.5 MG PO TABS: 2.5000 mg | ORAL_TABLET | Freq: Every day | ORAL | Status: DC

## 2015-04-10 ENCOUNTER — Inpatient Hospital Stay: Payer: Medicare Other | Admitting: Family Medicine

## 2015-04-10 ENCOUNTER — Non-Acute Institutional Stay (SKILLED_NURSING_FACILITY): Payer: Medicare Other | Admitting: Internal Medicine

## 2015-04-10 DIAGNOSIS — I25118 Atherosclerotic heart disease of native coronary artery with other forms of angina pectoris: Secondary | ICD-10-CM | POA: Diagnosis not present

## 2015-04-10 DIAGNOSIS — E1159 Type 2 diabetes mellitus with other circulatory complications: Secondary | ICD-10-CM

## 2015-04-10 DIAGNOSIS — A419 Sepsis, unspecified organism: Secondary | ICD-10-CM | POA: Diagnosis not present

## 2015-04-10 DIAGNOSIS — M0579 Rheumatoid arthritis with rheumatoid factor of multiple sites without organ or systems involvement: Secondary | ICD-10-CM

## 2015-04-10 DIAGNOSIS — J449 Chronic obstructive pulmonary disease, unspecified: Secondary | ICD-10-CM | POA: Diagnosis not present

## 2015-04-10 DIAGNOSIS — I2699 Other pulmonary embolism without acute cor pulmonale: Secondary | ICD-10-CM

## 2015-04-10 DIAGNOSIS — E785 Hyperlipidemia, unspecified: Secondary | ICD-10-CM

## 2015-04-10 DIAGNOSIS — N3289 Other specified disorders of bladder: Secondary | ICD-10-CM | POA: Diagnosis not present

## 2015-04-10 LAB — CULTURE, BODY FLUID W GRAM STAIN -BOTTLE: Culture: NO GROWTH

## 2015-04-10 NOTE — Assessment & Plan Note (Addendum)
Currently stable,SNF -  continue beta blocker. Holding HCTZ, lisinopril due to borderline hypotension , pt on statin and controlling DM

## 2015-04-10 NOTE — Assessment & Plan Note (Signed)
Patient met SIRS criteria with fever, tachycardia, hypoxia, lactic acidosis, leukocytosis, lactic acid normalized, influenza negative, blood cultures negative to date, urinalysis without evidence of UTI, no source found, he was initially on vancomycin, aztreonam and Levaquin, he was placed on Levaquin alone, will complete an empiric course with 3 additional days.

## 2015-04-10 NOTE — Assessment & Plan Note (Signed)
SNF - Cont crestor and fenofibrate forcontrolin setting og DM and CAD

## 2015-04-10 NOTE — Assessment & Plan Note (Signed)
Iv steroids on 11/6 with improvement in his generalized painSNF - continue prednisone 40 mg daily for 3 additional days then return to 20 mg daily, cont enbrel

## 2015-04-10 NOTE — Assessment & Plan Note (Signed)
Mod controlled, A1c 7.2; SNF- cont metformin 1000mg  BID, pt on statin and ACE being held 2/2 hypotension;

## 2015-04-10 NOTE — Assessment & Plan Note (Signed)
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 Patient with long standing history of smoking, urology consulted, plan for TURP as an outpatient

## 2015-04-10 NOTE — Assessment & Plan Note (Signed)
SNF - without exacerbation ;cont symbicort

## 2015-04-10 NOTE — Assessment & Plan Note (Signed)
on right upper lobe pulmonary artery, right middle lobe pulmonary artery and branches of the right lower lobe pulmonary. This was not present on his CT angio in August 2016. SNF - cont  Eliquis

## 2015-04-10 NOTE — Progress Notes (Signed)
MRN: [id number] Name: Jorge Malone  Sex: male Age: 74 y.o. DOB: 20-Nov-1940  [place] #: [name] Facility/Room:131 Level Of Care: SNF Provider: Inocencio Homes D Emergency Contacts: Extended Emergency Contact Information Primary Emergency Contact: Schroth,Theresa Address: [phone]          [place], [place] [address] [place] Phone: [phone] Relation: Daughter  Code Status:   Allergies: Amoxicillin  Chief Complaint  Patient presents with  . New Admit To SNF    HPI: Patient is 74 y.o. male whodiabetes mellitus, rheumatoid arthritis, hyperlipidemia, coronary disease, hypertension presented to ED after a fall, diffuse body aches, right knee pain. He appeared septic and was admitted to [facility] from 11/4-8 with SIRS and subacute vs chronic PE R upper,middle and lower lobe arteries. Pt is admitted to SNF with generalized weakness. While at SNF pt will be followed for DM2, tx with metformin, ACE and statin, HTN, tx withHCTZ, lisinopril and metoprolol, and RA , tx with chronic prednisone.  Past Medical History  Diagnosis Date  . Diabetes mellitus without complication ([place])   . Rheumatoid arthritis ([place])   . High cholesterol   . Coronary artery disease   . Hyperlipidemia 12/13/2014  . Cancer ([place])   . Hypertension     Past Surgical History  Procedure Laterality Date  . Coronary stent placement    . Brain surgery        Medication List       This list is accurate as of: 04/10/15 11:59 PM.  Always use your most recent med list.               apixaban 5 MG Tabs tablet  Commonly known as:  ELIQUIS  Take 2 tablets (10 mg total) by mouth 2 (two) times daily. For 7 days, then 5 mg twice daily.     budesonide-formoterol 160-4.5 MCG/ACT inhaler  Commonly known as:  SYMBICORT  Inhale 2 puffs into the lungs 2 (two) times daily.     colchicine 0.6 MG tablet  Take 0.6 mg by mouth 2 (two) times daily as needed.     ENBREL SURECLICK 50 MG/ML injection   Generic drug:  etanercept  Inject 50 mg into the skin once a week. On Sundays     fenofibrate 48 MG tablet  Commonly known as:  TRICOR  Take 48 mg by mouth daily.     folic acid 1 MG tablet  Commonly known as:  FOLVITE  Take 1 tablet (1 mg total) by mouth daily.     glucose blood test strip  Commonly known as:  TRUETEST TEST  Check sugars twice per day for E11.9     glucose blood test strip  Use as instructed     hydrochlorothiazide 25 MG tablet  Commonly known as:  HYDRODIURIL  Take 1 tablet (25 mg total) by mouth daily.     HYDROcodone-acetaminophen 5-325 MG tablet  Commonly known as:  NORCO/VICODIN  Take 1-2 tablets by mouth every 4 (four) hours as needed for moderate pain.     levalbuterol 0.63 MG/3ML nebulizer solution  Commonly known as:  XOPENEX  Take 3 mLs (0.63 mg total) by nebulization every 6 (six) hours as needed for wheezing or shortness of breath. Use 3 times daily x 4 days, then every 6 hours as needed for SOB.     levofloxacin 500 MG tablet  Commonly known as:  LEVAQUIN  Take 1 tablet (500 mg total) by mouth daily. For 3 more days  lisinopril 2.5 MG tablet  Commonly known as:  PRINIVIL,ZESTRIL  Take 1 tablet (2.5 mg total) by mouth daily.     metFORMIN 1000 MG tablet  Commonly known as:  GLUCOPHAGE  TAKE 1 TABLET (1,000 MG TOTAL) BY MOUTH DAILY WITH BREAKFAST.     metoprolol tartrate 25 MG tablet  Commonly known as:  LOPRESSOR  Take 1 tablet (25 mg total) by mouth daily.     multivitamin tablet  Take 1 tablet by mouth daily.     ONE TOUCH BASIC SYSTEM W/DEVICE Kit  TID and QHS     predniSONE 20 MG tablet  Commonly known as:  DELTASONE  Take 2 tablets (40 mg total) by mouth daily. For 3 days then return to 20 mg daily     rosuvastatin 5 MG tablet  Commonly known as:  CRESTOR  Take 5 mg by mouth daily.     tiotropium 18 MCG inhalation capsule  Commonly known as:  SPIRIVA HANDIHALER  Place 1 capsule (18 mcg total) into inhaler and inhale  daily.        No orders of the defined types were placed in this encounter.     There is no immunization history on file for this patient.  Social History  Substance Use Topics  . Smoking status: Current Every Day Smoker -- 0.50 packs/day    Types: Cigarettes    Last Attempt to Quit: 12/11/2014  . Smokeless tobacco: Not on file  . Alcohol Use: Yes     Comment: occasional    Family history is +DM2   Review of Systems  DATA OBTAINED: from patient, nurs GENERAL:  no fevers, fatigue, appetite changes SKIN: No itching, rash or wounds EYES: No eye pain, redness, discharge EARS: No earache, tinnitus, change in hearing NOSE: No congestion, drainage or bleeding  MOUTH/THROAT: No mouth or tooth pain, No sore throat RESPIRATORY: No cough, wheezing, SOB CARDIAC: No chest pain, palpitations, lower extremity edema  GI: No abdominal pain, No N/V/D or constipation, No heartburn or reflux  GU: No dysuria, frequency or urgency, or incontinence  MUSCULOSKELETAL: No unrelieved bone/joint pain NEUROLOGIC: No headache, dizziness or focal weakness PSYCHIATRIC: No c/o anxiety or sadness   Filed Vitals:   04/13/15 2051  BP: 110/65  Pulse: 62  Temp: 97.6 F (36.4 C)  Resp: 18    SpO2 Readings from Last 1 Encounters:  04/08/15 96%        Physical Exam  GENERAL APPEARANCE: Alert, conversant,  No acute distress.  SKIN: No diaphoresis rash HEAD: Normocephalic, atraumatic  EYES: Conjunctiva/lids clear. Pupils round, reactive. EOMs intact.  EARS: External exam WNL, canals clear. Hearing grossly normal.  NOSE: No deformity or discharge.  MOUTH/THROAT: Lips w/o lesions  RESPIRATORY: Breathing is even, unlabored. Lung sounds are clear   CARDIOVASCULAR: Heart RRR no murmurs, rubs, + gallop. 1+ peripheral edema.   GASTROINTESTINAL: Abdomen is soft, non-tender, not distended w/ normal bowel sounds. GENITOURINARY: Bladder non tender, not distended  MUSCULOSKELETAL: No abnormal joints or  musculature NEUROLOGIC:  Cranial nerves 2-12 grossly intact. Moves all extremities  PSYCHIATRIC: Mood and affect appropriate to situation, no behavioral issues  Patient Active Problem List   Diagnosis Date Noted  . Acute pulmonary embolism ([place]) 04/05/2015  . Knee effusion 04/05/2015  . Mass of urinary bladder 04/05/2015  . Tobacco abuse 04/05/2015  . Sepsis ([place]) 04/04/2015  . Abdominal pain 04/04/2015  . Right knee pain 04/04/2015  . Lactic acidosis 04/04/2015  . Fall 04/04/2015  . HTN (  hypertension) 01/10/2015  . Volume overload 12/14/2014  . Hyperlipidemia 12/13/2014  . COPD (chronic obstructive pulmonary disease) ([place]) 12/12/2014  . Rheumatoid arthritis ([place]) 12/12/2014  . DM2 (diabetes mellitus, type 2) ([place]) 12/12/2014  . CAD (coronary artery disease) 12/12/2014  . Acute respiratory failure with hypoxia ([facility]) 12/12/2014    CBC    Component Value Date/Time   WBC 13.7* 04/08/2015 0500   RBC 3.93* 04/08/2015 0500   HGB 11.4* 04/08/2015 0500   HCT 35.5* 04/08/2015 0500   PLT 191 04/08/2015 0500   MCV 90.3 04/08/2015 0500   LYMPHSABS 1.5 04/04/2015 0515   MONOABS 1.1* 04/04/2015 0515   EOSABS 0.1 04/04/2015 0515   BASOSABS 0.0 04/04/2015 0515    CMP     Component Value Date/Time   NA 138 04/05/2015 0123   K 3.8 04/05/2015 0123   CL 108 04/05/2015 0123   CO2 23 04/05/2015 0123   GLUCOSE 108* 04/05/2015 0123   BUN 8 04/05/2015 0123   CREATININE 0.72 04/05/2015 0123   CALCIUM 8.4* 04/05/2015 0123   PROT 6.9 04/04/2015 0515   ALBUMIN 3.1* 04/04/2015 0515   AST 21 04/04/2015 0515   ALT 15* 04/04/2015 0515   ALKPHOS 55 04/04/2015 0515   BILITOT 1.1 04/04/2015 0515   GFRNONAA >60 04/05/2015 0123   GFRAA >60 04/05/2015 0123    Lab Results  Component Value Date   HGBA1C 7.3* 04/04/2015     Ct Angio Chest Pe W/cm &/or Wo Cm  04/04/2015  CLINICAL DATA:  74 year old who fell at home while walking to the bathroom after getting out of bed earlier today. 3-4 day  history of right pleuritic chest pain, cough, and right upper quadrant abdominal pain. Laboratory data demonstrates leukocytosis with [place] [address] and and elevated lipase of 57. EXAM: CT ANGIOGRAPHY CHEST CT ABDOMEN AND PELVIS WITH CONTRAST TECHNIQUE: Multidetector CT imaging of the chest was performed using the standard protocol during bolus administration of intravenous contrast. Multiplanar CT image reconstructions and MIPs were obtained to evaluate the vascular anatomy. Multidetector CT imaging of the abdomen and pelvis was performed using the standard protocol during bolus administration of intravenous contrast. CONTRAST:  178m OMNIPAQUE IOHEXOL 350 MG/ML IV. COMPARISON:  CTA chest abdomen and pelvis 01/03/2015. FINDINGS: CTA CHEST FINDINGS Contrast opacification of pulmonary arteries is good. Respiratory motion blurs many of the images throughout the chest. Overall, the study is of moderate to good diagnostic quality. Filling defects within branches of the right upper lobe pulmonary artery, though the defects are peripheral and along the vessel wall. Similar peripheral filling defects in the right middle lobe pulmonary artery and a lateral segmental branch of the right lower lobe pulmonary artery. No definite filling defects in the branches of the left pulmonary artery. RV/LV ratio = 1.12. Heart enlarged with evidence of left ventricular hypertrophy. Prominent epicardial fat. Moderate LAD coronary atherosclerosis. No pericardial effusion. Mild atherosclerosis involving the thoracic aorta without aneurysm or dissection. Mild peripheral interstitial fibrosis involving the right middle lobe and both lower lobes, unchanged. Atelectasis in the lower lobes. Small bilateral pleural effusions. No confluent airspace consolidation. Scattered small bilateral pleural plaques, noncalcified, unchanged. Numerous normal-sized and upper normal sized mediastinal lymph nodes, the largest a right lower  mediastinal (station 8) nodal mass measuring approximately 2.5 x 2.8 cm (previously 2.0 x 2.6 cm. AP window (station 5) lymph nodes have also increased in size since the prior examination. Station 4R and 4L lymph nodes have also increased in size. An enlarged  right hilar lymph node measures approximately 2.6 x 2.2 cm, unchanged. Left hilar lymph nodes are normal in size but have increased since the prior examination. No axillary lymphadenopathy. Extensive mucous plugging within the trachea, both mainstem bronchi, with extension into the right upper lobe and left lower lobe bronchi. 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 Generalized osseous demineralization. Review of the MIP images confirms the above findings. CT ABDOMEN and PELVIS FINDINGS Liver normal in size and appearance. Gallbladder normal in appearance without calcified gallstones. No biliary ductal dilation. Pancreas normal in appearance without evidence of mass, ductal dilation, or inflammation. Spleen normal in size and appearance. Adrenal glands normal in appearance. Bilateral renal sinus lipomatosis. Cortical cysts involving both kidneys. Nonobstructing small bilateral renal calculi. No hydronephrosis. No solid renal masses. Approximate 4.0 x 6.3 x 5.6 cm mass involving the right posterolateral wall of the urinary bladder at its base. Stomach normal in appearance for the degree of distention. Normal-appearing small bowel. Distal descending and sigmoid colon diverticulosis without evidence of acute diverticulitis. Remainder of the colon unremarkable with moderate stool burden. Normal-appearing appendix without evidence of inflammation. No ascites. Severe aortoiliofemoral atherosclerosis without aneurysm. Visceral arteries patent though atherosclerotic. No pathologic lymphadenopathy. Moderate prostate gland enlargement. Normal-appearing seminal vesicles. Penile prosthesis with the inflation device in the right  anterior low pelvis adjacent to the urinary bladder. Bone window images demonstrate severe degenerative changes in the left hip, moderate degenerative changes in the right hip, ankylosis of the sacroiliac joints, and multilevel degenerative disc disease, spondylosis and facet degenerative changes throughout the lumbar spine. IMPRESSION: 1. Findings consistent with subacute or chronic pulmonary emboli involving the right upper lobe pulmonary artery, right middle lobe pulmonary artery and branches of the right lower lobe pulmonary artery. 2. Atelectasis involving the lower lobes. Small bilateral pleural effusions. 3. Enlarging mediastinal lymph nodes. Index nodes are measured above. 4. Extensive mucous plugging involving the trachea, bilateral mainstem bronchi, with extension into the right upper lobe and left lower lobe bronchi. 5. Large mass arising from the right posterolateral wall of the urinary bladder at its base. Measurements given above. 6. Distal descending and sigmoid colon diverticulosis without evidence of acute diverticulitis. 7. Osseous findings as above.  No acute osseous abnormalities. Electronically Signed   By: Evangeline Dakin M.D.   On: 04/04/2015 12:16   Ct Abdomen Pelvis W Contrast  04/04/2015  CLINICAL DATA:  74 year old who fell at home while walking to the bathroom after getting out of bed earlier today. 3-4 day history of right pleuritic chest pain, cough, and right upper quadrant abdominal pain. Laboratory data demonstrates leukocytosis with a [place] of [address] and and elevated lipase of 57. EXAM: CT ANGIOGRAPHY CHEST CT ABDOMEN AND PELVIS WITH CONTRAST TECHNIQUE: Multidetector CT imaging of the chest was performed using the standard protocol during bolus administration of intravenous contrast. Multiplanar CT image reconstructions and MIPs were obtained to evaluate the vascular anatomy. Multidetector CT imaging of the abdomen and pelvis was performed using the standard protocol  during bolus administration of intravenous contrast. CONTRAST:  174m OMNIPAQUE IOHEXOL 350 MG/ML IV. COMPARISON:  CTA chest abdomen and pelvis 01/03/2015. FINDINGS: CTA CHEST FINDINGS Contrast opacification of pulmonary arteries is good. Respiratory motion blurs many of the images throughout the chest. Overall, the study is of moderate to good diagnostic quality. Filling defects within branches of the right upper lobe pulmonary artery, though the defects are peripheral and along the vessel wall. Similar peripheral filling defects in the right  middle lobe pulmonary artery and a lateral segmental branch of the right lower lobe pulmonary artery. No definite filling defects in the branches of the left pulmonary artery. RV/LV ratio = 1.12. Heart enlarged with evidence of left ventricular hypertrophy. Prominent epicardial fat. Moderate LAD coronary atherosclerosis. No pericardial effusion. Mild atherosclerosis involving the thoracic aorta without aneurysm or dissection. Mild peripheral interstitial fibrosis involving the right middle lobe and both lower lobes, unchanged. Atelectasis in the lower lobes. Small bilateral pleural effusions. No confluent airspace consolidation. Scattered small bilateral pleural plaques, noncalcified, unchanged. Numerous normal-sized and upper normal sized mediastinal lymph nodes, the largest a right lower mediastinal (station 8) nodal mass measuring approximately 2.5 x 2.8 cm (previously 2.0 x 2.6 cm. AP window (station 5) lymph nodes have also increased in size since the prior examination. Station 4R and 4L lymph nodes have also increased in size. An enlarged right hilar lymph node measures approximately 2.6 x 2.2 cm, unchanged. Left hilar lymph nodes are normal in size but have increased since the prior examination. No axillary lymphadenopathy. Extensive mucous plugging within the trachea, both mainstem bronchi, with extension into the right upper lobe and left lower lobe bronchi. DISH,  degenerative disc disease and spondylosis throughout the thoracic spine. Generalized osseous demineralization. Review of the MIP images confirms the above findings. CT ABDOMEN and PELVIS FINDINGS Liver normal in size and appearance. Gallbladder normal in appearance without calcified gallstones. No biliary ductal dilation. Pancreas normal in appearance without evidence of mass, ductal dilation, or inflammation. Spleen normal in size and appearance. Adrenal glands normal in appearance. Bilateral renal sinus lipomatosis. Cortical cysts involving both kidneys. Nonobstructing small bilateral renal calculi. No hydronephrosis. No solid renal masses. Approximate 4.0 x 6.3 x 5.6 cm mass involving the right posterolateral wall of the urinary bladder at its base. Stomach normal in appearance for the degree of distention. Normal-appearing small bowel. Distal descending and sigmoid colon diverticulosis without evidence of acute diverticulitis. Remainder of the colon unremarkable with moderate stool burden. Normal-appearing appendix without evidence of inflammation. No ascites. Severe aortoiliofemoral atherosclerosis without aneurysm. Visceral arteries patent though atherosclerotic. No pathologic lymphadenopathy. Moderate prostate gland enlargement. Normal-appearing seminal vesicles. Penile prosthesis with the inflation device in the right anterior low pelvis adjacent to the urinary bladder. Bone window images demonstrate severe degenerative changes in the left hip, moderate degenerative changes in the right hip, ankylosis of the sacroiliac joints, and multilevel degenerative disc disease, spondylosis and facet degenerative changes throughout the lumbar spine. IMPRESSION: 1. Findings consistent with subacute or chronic pulmonary emboli involving the right upper lobe pulmonary artery, right middle lobe pulmonary artery and branches of the right lower lobe pulmonary artery. 2. Atelectasis involving the lower lobes. Small bilateral  pleural effusions. 3. Enlarging mediastinal lymph nodes. Index nodes are measured above. 4. Extensive mucous plugging involving the trachea, bilateral mainstem bronchi, with extension into the right upper lobe and left lower lobe bronchi. 5. Large mass arising from the right posterolateral wall of the urinary bladder at its base. Measurements given above. 6. Distal descending and sigmoid colon diverticulosis without evidence of acute diverticulitis. 7. Osseous findings as above.  No acute osseous abnormalities. Electronically Signed   By: Evangeline Dakin M.D.   On: 04/04/2015 12:16   Dg Chest Port 1 View  04/04/2015  CLINICAL DATA:  Code sepsis. Tachycardia. History COPD, diabetes, coronary artery disease and hypertension. EXAM: PORTABLE CHEST 1 VIEW COMPARISON:  Chest radiograph January 02, 2015 FINDINGS: Cardiac silhouette is mildly enlarged unchanged. Mediastinal silhouette is nonsuspicious.  Strandy densities in lung bases, no pleural effusion or focal consolidation. Bilateral midlung zone scarring. No pneumothorax. Soft tissue planes and included osseous structure nonsuspicious. IMPRESSION: Stable cardiomegaly.  Bibasilar atelectasis. Electronically Signed   By: Elon Alas M.D.   On: 04/04/2015 05:44   Dg Knee Complete 4 Views Right  04/04/2015  CLINICAL DATA:  Acute anterior right knee pain for 4 weeks with swelling. EXAM: RIGHT KNEE - COMPLETE 4+ VIEW COMPARISON:  None. FINDINGS: Diffuse soft tissue swelling/subcutaneous edema evident. Normal alignment without acute osseous finding or fracture. Moderately large joint effusion noted on the lateral view. IMPRESSION: Soft tissue swelling and right knee joint effusion. No acute osseous finding. Electronically Signed   By: Jerilynn Mages.  Shick M.D.   On: 04/04/2015 08:14    Not all labs, radiology exams or other studies done during hospitalization come through on my EPIC note; however they are reviewed by me.    Assessment and Plan  Sepsis [facility]) Patient  met SIRS criteria with fever, tachycardia, hypoxia, lactic acidosis, leukocytosis, lactic acid normalized, influenza negative, blood cultures negative to date, urinalysis without evidence of UTI, no source found, he was initially on vancomycin, aztreonam and Levaquin, he was placed on Levaquin alone, will complete an empiric course with 3 additional days.   Acute pulmonary embolism ([facility]) on right upper lobe pulmonary artery, right middle lobe pulmonary artery and branches of the right lower lobe pulmonary. This was not present on his CT angio in August 2016. SNF - cont  Eliquis  Mass of urinary bladder 4.0 x 6.3 x 5.6 cm mass involving the right posterolateral wall of the urinary bladder at its base. Patient with long standing history of smoking, urology consulted, plan for TURP as an outpatient   CAD (coronary artery disease) Currently stable,SNF -  continue beta blocker. Holding HCTZ, lisinopril due to borderline hypotension , pt on statin and controlling DM  Rheumatoid arthritis ([place]) Iv steroids on 11/6 with improvement in his generalized painSNF - continue prednisone 40 mg daily for 3 additional days then return to 20 mg daily, cont enbrel  DM2 (diabetes mellitus, type 2) ([place]) Mod controlled, A1c 7.2; SNF- cont metformin 1029m BID, pt on statin and ACE being held 2/2 hypotension;   Hyperlipidemia SNF - Cont crestor and fenofibrate forcontrolin setting og DM and CAD  COPD (chronic obstructive pulmonary disease) [place] [facility] - without exacerbation ;cont symbicort   Time spent 45 min;> 50% of time with patient was spent reviewing records, labs, tests and studies, counseling and developing plan of care  AHennie Duos MD

## 2015-04-13 ENCOUNTER — Encounter: Payer: Self-pay | Admitting: Internal Medicine

## 2015-04-14 ENCOUNTER — Other Ambulatory Visit: Payer: Self-pay

## 2015-04-14 ENCOUNTER — Other Ambulatory Visit: Payer: Self-pay | Admitting: Internal Medicine

## 2015-04-14 ENCOUNTER — Telehealth: Payer: Self-pay

## 2015-04-14 NOTE — Telephone Encounter (Signed)
CMA called pt, pt daughter was available due to the patient being rehab at the moment. Jorge Malone, his daughter, states he's been in rehab for almost a week and should be returning home shortly. Pt daughter would like to find another Rheumatologist in the area.

## 2015-04-16 ENCOUNTER — Ambulatory Visit: Payer: Medicare Other | Admitting: Internal Medicine

## 2015-04-21 ENCOUNTER — Encounter: Payer: Self-pay | Admitting: Internal Medicine

## 2015-04-21 ENCOUNTER — Non-Acute Institutional Stay (SKILLED_NURSING_FACILITY): Payer: Medicare Other | Admitting: Internal Medicine

## 2015-04-21 DIAGNOSIS — A419 Sepsis, unspecified organism: Secondary | ICD-10-CM

## 2015-04-21 DIAGNOSIS — I25118 Atherosclerotic heart disease of native coronary artery with other forms of angina pectoris: Secondary | ICD-10-CM | POA: Diagnosis not present

## 2015-04-21 DIAGNOSIS — I2699 Other pulmonary embolism without acute cor pulmonale: Secondary | ICD-10-CM | POA: Diagnosis not present

## 2015-04-21 DIAGNOSIS — E785 Hyperlipidemia, unspecified: Secondary | ICD-10-CM | POA: Diagnosis not present

## 2015-04-21 DIAGNOSIS — N3289 Other specified disorders of bladder: Secondary | ICD-10-CM

## 2015-04-21 DIAGNOSIS — I10 Essential (primary) hypertension: Secondary | ICD-10-CM

## 2015-04-21 DIAGNOSIS — M0579 Rheumatoid arthritis with rheumatoid factor of multiple sites without organ or systems involvement: Secondary | ICD-10-CM

## 2015-04-21 DIAGNOSIS — Z720 Tobacco use: Secondary | ICD-10-CM

## 2015-04-21 DIAGNOSIS — E1159 Type 2 diabetes mellitus with other circulatory complications: Secondary | ICD-10-CM

## 2015-04-21 NOTE — Progress Notes (Signed)
MRN: [id number] Name: Jorge Malone  Sex: male Age: 74 y.o. DOB: 12/28/1940  [place] #: Jorge Malone Facility/Room: 131 Level Of Care: SNF Provider: Inocencio Malone D Emergency Contacts: Extended Emergency Contact Information Primary Emergency Contact: Jorge Malone Address: [phone]          [place], [place] [address] [place] Phone: [phone] Relation: Daughter  Code Status:   Allergies: Amoxicillin  Chief Complaint  Patient presents with  . Discharge Note    HPI: Patient is 74 y.o. male with diabetes mellitus, rheumatoid arthritis, hyperlipidemia, coronary disease, hypertension who presented to ED after a fall, diffuse body aches, right knee pain. He appeared septic and was admitted to [facility] from 11/4-8 with SIRS and subacute vs chronic PE R upper,middle and lower lobe arteries. Pt was admitted to SNF with generalized weakness and he is now ready to be d/c to home.  Past Medical History  Diagnosis Date  . Diabetes mellitus without complication ([place])   . Rheumatoid arthritis ([place])   . High cholesterol   . Coronary artery disease   . Hyperlipidemia 12/13/2014  . Cancer ([place])   . Hypertension     Past Surgical History  Procedure Laterality Date  . Coronary stent placement    . Brain surgery        Medication List       This list is accurate as of: 04/21/15  1:24 PM.  Always use your most recent med list.               apixaban 5 MG Tabs tablet  Commonly known as:  ELIQUIS  Take 2 tablets (10 mg total) by mouth 2 (two) times daily. For 7 days, then 5 mg twice daily.     budesonide-formoterol 160-4.5 MCG/ACT inhaler  Commonly known as:  SYMBICORT  Inhale 2 puffs into the lungs 2 (two) times daily.     colchicine 0.6 MG tablet  Take 0.6 mg by mouth 2 (two) times daily as needed.     ENBREL SURECLICK 50 MG/ML injection  Generic drug:  etanercept  Inject 50 mg into the skin once a week. On Sundays     fenofibrate 48 MG tablet  Commonly  known as:  TRICOR  Take 48 mg by mouth daily.     folic acid 1 MG tablet  Commonly known as:  FOLVITE  Take 1 tablet (1 mg total) by mouth daily.     glucose blood test strip  Commonly known as:  TRUETEST TEST  Check sugars twice per day for E11.9     glucose blood test strip  Use as instructed     hydrochlorothiazide 25 MG tablet  Commonly known as:  HYDRODIURIL  Take 1 tablet (25 mg total) by mouth daily.     HYDROcodone-acetaminophen 5-325 MG tablet  Commonly known as:  NORCO/VICODIN  Take 1-2 tablets by mouth every 4 (four) hours as needed for moderate pain.     levalbuterol 0.63 MG/3ML nebulizer solution  Commonly known as:  XOPENEX  Take 3 mLs (0.63 mg total) by nebulization every 6 (six) hours as needed for wheezing or shortness of breath. Use 3 times daily x 4 days, then every 6 hours as needed for SOB.     levofloxacin 500 MG tablet  Commonly known as:  LEVAQUIN  Take 1 tablet (500 mg total) by mouth daily. For 3 more days     lisinopril 2.5 MG tablet  Commonly known as:  PRINIVIL,ZESTRIL  Take  1 tablet (2.5 mg total) by mouth daily.     metFORMIN 1000 MG tablet  Commonly known as:  GLUCOPHAGE  TAKE 1 TABLET (1,000 MG TOTAL) BY MOUTH DAILY WITH BREAKFAST.     metoprolol tartrate 25 MG tablet  Commonly known as:  LOPRESSOR  Take 1 tablet (25 mg total) by mouth daily.     multivitamin tablet  Take 1 tablet by mouth daily.     ONE TOUCH BASIC SYSTEM W/DEVICE Kit  TID and QHS     predniSONE 5 MG tablet  Commonly known as:  DELTASONE  TAKE 1 TABLET BY MOUTH EVERY DAY WITH BREAKFAST     predniSONE 10 MG tablet  Commonly known as:  DELTASONE  Take 10 mg by mouth daily.     predniSONE 20 MG tablet  Commonly known as:  DELTASONE  Take 2 tablets (40 mg total) by mouth daily. For 3 days then return to 20 mg daily     rosuvastatin 5 MG tablet  Commonly known as:  CRESTOR  Take 5 mg by mouth daily.     SPIRIVA HANDIHALER 18 MCG inhalation capsule  Generic  drug:  tiotropium  PLACE 1 CAPSULE INTO INHALER DAILY     traMADol 50 MG tablet  Commonly known as:  ULTRAM  Take 50 mg by mouth every 8 (eight) hours as needed.        No orders of the defined types were placed in this encounter.     There is no immunization history on file for this patient.  Social History  Substance Use Topics  . Smoking status: Current Every Day Smoker -- 0.50 packs/day    Types: Cigarettes    Last Attempt to Quit: 12/11/2014  . Smokeless tobacco: Not on file  . Alcohol Use: Yes     Comment: occasional    Filed Vitals:   04/21/15 1321  BP: 110/65  Pulse: 62  Temp: 97.6 F (36.4 C)  Resp: 18    Physical Exam  GENERAL APPEARANCE: Alert, conversant. BM,No acute distress.  HEENT: Unremarkable. RESPIRATORY: Breathing is even, unlabored. Lung sounds are clear   CARDIOVASCULAR: Heart RRR no murmurs, rubs or gallops. No peripheral edema.  GASTROINTESTINAL: Abdomen is soft, non-tender, not distended w/ normal bowel sounds.  NEUROLOGIC: Cranial nerves 2-12 grossly intact. Moves all extremities  Patient Active Problem List   Diagnosis Date Noted  . Acute pulmonary embolism ([place]) 04/05/2015  . Knee effusion 04/05/2015  . Mass of urinary bladder 04/05/2015  . Tobacco abuse 04/05/2015  . Sepsis ([place]) 04/04/2015  . Abdominal pain 04/04/2015  . Right knee pain 04/04/2015  . Lactic acidosis 04/04/2015  . Fall 04/04/2015  . HTN (hypertension) 01/10/2015  . Volume overload 12/14/2014  . Hyperlipidemia 12/13/2014  . COPD (chronic obstructive pulmonary disease) ([place]) 12/12/2014  . Rheumatoid arthritis ([place]) 12/12/2014  . DM2 (diabetes mellitus, type 2) ([place]) 12/12/2014  . CAD (coronary artery disease) 12/12/2014  . Acute respiratory failure with hypoxia ([facility]) 12/12/2014    CBC    Component Value Date/Time   WBC 13.7* 04/08/2015 0500   RBC 3.93* 04/08/2015 0500   HGB 11.4* 04/08/2015 0500   HCT 35.5* 04/08/2015 0500   PLT 191 04/08/2015 0500    MCV 90.3 04/08/2015 0500   LYMPHSABS 1.5 04/04/2015 0515   MONOABS 1.1* 04/04/2015 0515   EOSABS 0.1 04/04/2015 0515   BASOSABS 0.0 04/04/2015 0515    CMP     Component Value Date/Time   NA 138 04/05/2015 0123  K 3.8 04/05/2015 0123   CL 108 04/05/2015 0123   CO2 23 04/05/2015 0123   GLUCOSE 108* 04/05/2015 0123   BUN 8 04/05/2015 0123   CREATININE 0.72 04/05/2015 0123   CALCIUM 8.4* 04/05/2015 0123   PROT 6.9 04/04/2015 0515   ALBUMIN 3.1* 04/04/2015 0515   AST 21 04/04/2015 0515   ALT 15* 04/04/2015 0515   ALKPHOS 55 04/04/2015 0515   BILITOT 1.1 04/04/2015 0515   GFRNONAA >60 04/05/2015 0123   GFRAA >60 04/05/2015 0123    Assessment and Plan  Pt is d/c to home with HH/OT/PT. Pt's rx;s have been written.  Hennie Duos, MD

## 2015-04-22 ENCOUNTER — Other Ambulatory Visit: Payer: Self-pay | Admitting: Internal Medicine

## 2015-04-23 ENCOUNTER — Encounter: Payer: Self-pay | Admitting: Cardiology

## 2015-04-23 ENCOUNTER — Ambulatory Visit (INDEPENDENT_AMBULATORY_CARE_PROVIDER_SITE_OTHER): Payer: Medicare Other | Admitting: Cardiology

## 2015-04-23 ENCOUNTER — Ambulatory Visit (HOSPITAL_COMMUNITY): Payer: Medicare Other | Attending: Cardiology

## 2015-04-23 ENCOUNTER — Other Ambulatory Visit: Payer: Self-pay

## 2015-04-23 VITALS — BP 124/70 | HR 128 | Ht 69.0 in | Wt 216.0 lb

## 2015-04-23 DIAGNOSIS — I2510 Atherosclerotic heart disease of native coronary artery without angina pectoris: Secondary | ICD-10-CM | POA: Diagnosis not present

## 2015-04-23 DIAGNOSIS — R000 Tachycardia, unspecified: Secondary | ICD-10-CM | POA: Diagnosis not present

## 2015-04-23 DIAGNOSIS — R0789 Other chest pain: Secondary | ICD-10-CM | POA: Diagnosis not present

## 2015-04-23 DIAGNOSIS — E785 Hyperlipidemia, unspecified: Secondary | ICD-10-CM | POA: Diagnosis not present

## 2015-04-23 DIAGNOSIS — I517 Cardiomegaly: Secondary | ICD-10-CM | POA: Insufficient documentation

## 2015-04-23 DIAGNOSIS — I313 Pericardial effusion (noninflammatory): Secondary | ICD-10-CM | POA: Diagnosis not present

## 2015-04-23 DIAGNOSIS — I10 Essential (primary) hypertension: Secondary | ICD-10-CM

## 2015-04-23 DIAGNOSIS — D497 Neoplasm of unspecified behavior of endocrine glands and other parts of nervous system: Secondary | ICD-10-CM | POA: Insufficient documentation

## 2015-04-23 DIAGNOSIS — I2583 Coronary atherosclerosis due to lipid rich plaque: Principal | ICD-10-CM

## 2015-04-23 DIAGNOSIS — I2699 Other pulmonary embolism without acute cor pulmonale: Secondary | ICD-10-CM | POA: Diagnosis not present

## 2015-04-23 MED ORDER — METOPROLOL TARTRATE 25 MG PO TABS: 25.0000 mg | ORAL_TABLET | Freq: Two times a day (BID) | ORAL | Status: AC

## 2015-04-23 NOTE — Patient Instructions (Signed)
Medication Instructions:  Your physician has recommended you make the following change in your medication:  1) INCREASE METOPROLOL to 25 mg TWICE DAILY  Labwork: TODAY: BMET, CBC, TSH  IN ONE WEEK: FASTING LIPIDS, ALT  Testing/Procedures: You are having a LIMITED ECHO TODAY.  Your physician has requested that you have an echocardiogram. Echocardiography is a painless test that uses sound waves to create images of your heart. It provides your doctor with information about the size and shape of your heart and how well your heart's chambers and valves are working. This procedure takes approximately one hour. There are no restrictions for this procedure.  Follow-Up: Your physician recommends that you schedule a follow-up appointment in: 1 week with an APP.  Your physician wants you to follow-up in: 6 months with Dr. Radford Pax. You will receive a reminder letter in the mail two months in advance. If you don't receive a letter, please call our office to schedule the follow-up appointment.   Any Other Special Instructions Will Be Listed Below (If Applicable).     If you need a refill on your cardiac medications before your next appointment, please call your pharmacy.

## 2015-04-23 NOTE — Progress Notes (Signed)
Cardiology Office Note   Date:  04/23/2015   ID:  Jorge Malone, DOB 01/23/1941, MRN [id number]  PCP:  Lance Bosch, NP    Chief Complaint  Patient presents with  . Chest Pain  . Coronary Artery Disease  . Hypertension      History of Present Illness: Patient is 74 y.o. male with diabetes mellitus, rheumatoid arthritis, hyperlipidemia, coronary disease, hypertension and chronic diastolic CHF who presented to ED after a fall, diffuse body aches, right knee pain. He appeared septic and was admitted to [facility] from 11/4-8 with SIRS and subacute vs chronic PE R upper,middle and lower lobe arteries. He was found to have a minimally elevated troponin felt secondary to acute PE with demand ischemia.  He is referred now to establish a new Cardiologist after moving here from [place].  We do not have any records of his prior cardiac history at this time.  He is a poor historian.  He was seen by his PCP back in July and he was complaining of CP.  He described it as a sharp pain when he would cough, quick turns or turn over.  It is right sided. His anginal pain in the past was sharp as well but occurred on the left side.  The pain he has now only lasts a second.  The pain lately has improved after starting pain medication.  Lately he has noticed increased SOB since being in the hospital.  He has chronic LE edema.  He denies any dizziness, palpitations, claudication, or syncope.     Past Medical History  Diagnosis Date  . Diabetes mellitus without complication ([place])   . Rheumatoid arthritis ([place])   . High cholesterol   . Coronary artery disease   . Hyperlipidemia 12/13/2014  . Cancer ([place])   . Hypertension   . 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 Pulmonary embolism [facility])     Past Surgical History  Procedure Laterality Date  . Coronary stent placement    . Brain surgery       Current Outpatient Prescriptions  Medication Sig Dispense Refill  . apixaban (ELIQUIS) 5 MG TABS tablet Take 2  tablets (10 mg total) by mouth 2 (two) times daily. For 7 days, then 5 mg twice daily. 60 tablet   . Blood Glucose Monitoring Suppl (ONE TOUCH BASIC SYSTEM) W/DEVICE KIT TID and QHS 1 each 0  . budesonide-formoterol (SYMBICORT) 160-4.5 MCG/ACT inhaler Inhale 2 puffs into the lungs 2 (two) times daily.    . colchicine 0.6 MG tablet Take 0.6 mg by mouth 2 (two) times daily as needed.  1  . etanercept (ENBREL SURECLICK) 50 MG/ML injection Inject 50 mg into the skin once a week. On Sundays    . fenofibrate (TRICOR) 48 MG tablet Take 48 mg by mouth daily.    . folic acid (FOLVITE) 1 MG tablet Take 1 tablet (1 mg total) by mouth daily. 30 tablet 2  . glucose blood (TRUETEST TEST) test strip Check sugars twice per day for E11.9 100 each 12  . glucose blood test strip Use as instructed 100 each 12  . hydrochlorothiazide (HYDRODIURIL) 25 MG tablet Take 1 tablet (25 mg total) by mouth daily. 30 tablet 3  . HYDROcodone-acetaminophen (NORCO/VICODIN) 5-325 MG tablet Take 1-2 tablets by mouth every 4 (four) hours as needed for moderate pain. (Patient taking differently: Take 1 tablet by mouth every 6 (six)  hours as needed for moderate pain. ) 30 tablet 0  . levalbuterol (XOPENEX) 0.63 MG/3ML nebulizer solution Take 3 mLs (0.63 mg total) by nebulization every 6 (six) hours as needed for wheezing or shortness of breath. Use 3 times daily x 4 days, then every 6 hours as needed for SOB. 360 mL 3  . lisinopril (PRINIVIL,ZESTRIL) 2.5 MG tablet Take 1 tablet (2.5 mg total) by mouth daily. 90 tablet 0  . metFORMIN (GLUCOPHAGE) 1000 MG tablet TAKE 1 TABLET (1,000 MG TOTAL) BY MOUTH DAILY WITH BREAKFAST. 60 tablet 2  . metoprolol tartrate (LOPRESSOR) 25 MG tablet Take 1 tablet (25 mg total) by mouth daily. 30 tablet 3  . Multiple Vitamin (MULTIVITAMIN) tablet Take 1 tablet by mouth daily.    . predniSONE (DELTASONE) 20 MG tablet Take 2 tablets (40 mg total) by mouth daily. For 3 days then return to 20 mg daily 10 tablet 0    . rosuvastatin (CRESTOR) 5 MG tablet Take 5 mg by mouth daily.    Marland Kitchen SPIRIVA HANDIHALER 18 MCG inhalation capsule PLACE 1 CAPSULE INTO INHALER DAILY 30 capsule 2   No current facility-administered medications for this visit.    Allergies:   Amoxicillin    Social History:  The patient  reports that he quit smoking about 4 months ago. His smoking use included Cigarettes. He smoked 0.50 packs per day. He does not have any smokeless tobacco history on file. He reports that he drinks alcohol. He reports that he does not use illicit drugs.   Family History:  The patient's family history includes Diabetes in his other.    ROS:  Please see the history of present illness.   Otherwise, review of systems are positive for none.   All other systems are reviewed and negative.    PHYSICAL EXAM: VS:  BP 124/70 mmHg  Pulse 128  Ht 5' 9"  (1.753 m)  Wt 97.977 kg (216 lb)  BMI 31.88 kg/m2 , BMI Body mass index is 31.88 kg/(m^2). GEN: Well nourished, well developed, in no acute distress HEENT: normal Neck: no JVD, carotid bruits, or masses Cardiac: RRR; no murmurs, rubs, or gallops,no edema  Respiratory:  clear to auscultation bilaterally, normal work of breathing GI: soft, nontender, nondistended, + BS MS: no deformity or atrophy Skin: warm and dry, no rash Neuro:  Strength and sensation are intact Psych: euthymic mood, full affect   EKG:  EKG was ordered today and showed sinus tachycardia with PAC's and nonspecific ST abnormality    Recent Labs: 04/04/2015: ALT 15* 04/05/2015: BUN 8; Creatinine, Ser 0.72; Potassium 3.8; Sodium 138 04/08/2015: Hemoglobin 11.4*; Platelets 191    Lipid Panel No results found for: CHOL, TRIG, HDL, CHOLHDL, VLDL, LDLCALC, LDLDIRECT    Wt Readings from Last 3 Encounters:  04/23/15 97.977 kg (216 lb)  04/04/15 102.331 kg (225 lb 9.6 oz)  02/14/15 102.513 kg (226 lb)       ASSESSMENT AND PLAN:  1.  ASCAD with remote stenting.  He is having pleuritic CP  that could be related to recent PE or musculoskeletal since it occurs with movement as well.  This has improved on pain meds.  I do not think this is related to coronary ischemia.  Given recent multiple PEs I would like avoid stress testing at this time. His EKG is nonischemic.  Continue statin and BB.  He is not on ASA due to NOAC. 2.  Chronic diastolic CHF - appears euvolemic.  Continue BB and diuretic. 3.  HTN - controlled on BB, HCTZ and ACE I 4.  Dyslipidemia - check FLP and ALT.  Continue statin. 5.  Recent PE on Eliquis.  6.  Sinus tachycardia of ? Etiology.  He has not taken his metoprolol today and only takes it once daily instead of BID. I will check a TSH, BMET and CBC.  Increase metoprolol to 22m BID.  F/U with PA in 1 week.  I will also get a 2D echo to assess LVF, RVF and rule out pericardial effusion given his pleuritic CP.     Current medicines are reviewed at length with the patient today.  The patient does not have concerns regarding medicines.  The following changes have been made:  Increase Lopressor to 286mBID  Labs/ tests ordered today: See above Assessment and Plan No orders of the defined types were placed in this encounter.     Disposition:   FU with me in 6 months  Signed, TUSueanne MargaritaMD  04/23/2015 11:29 AM    [facility] [address] [place]  [id number]: [phone]: [phone]

## 2015-04-29 ENCOUNTER — Telehealth: Payer: Self-pay | Admitting: Internal Medicine

## 2015-04-29 ENCOUNTER — Other Ambulatory Visit: Payer: Self-pay | Admitting: Internal Medicine

## 2015-04-29 DIAGNOSIS — W19XXXS Unspecified fall, sequela: Secondary | ICD-10-CM

## 2015-04-29 NOTE — Telephone Encounter (Signed)
Jorge Malone with Rehab facility called stating patient was discharged from hospital to facility for PT/OT. During stay patient wanted to leave facility for holidays although he was encouraged to stay. Since patient has been at home for the week he has suffered a fall [place] and daughter wants him to return to facility. They would like a order placed so patient may return to facility. Order has been placed and will be faxed to facility

## 2015-04-30 ENCOUNTER — Ambulatory Visit: Payer: Medicare Other | Admitting: Cardiology

## 2015-04-30 ENCOUNTER — Other Ambulatory Visit (INDEPENDENT_AMBULATORY_CARE_PROVIDER_SITE_OTHER): Payer: Medicare Other

## 2015-04-30 ENCOUNTER — Other Ambulatory Visit: Payer: Self-pay | Admitting: Cardiology

## 2015-04-30 DIAGNOSIS — E785 Hyperlipidemia, unspecified: Secondary | ICD-10-CM

## 2015-04-30 DIAGNOSIS — R0789 Other chest pain: Secondary | ICD-10-CM

## 2015-04-30 DIAGNOSIS — R000 Tachycardia, unspecified: Secondary | ICD-10-CM

## 2015-05-01 ENCOUNTER — Non-Acute Institutional Stay (SKILLED_NURSING_FACILITY): Payer: Medicare Other | Admitting: Internal Medicine

## 2015-05-01 ENCOUNTER — Encounter: Payer: Self-pay | Admitting: Internal Medicine

## 2015-05-01 ENCOUNTER — Ambulatory Visit (HOSPITAL_COMMUNITY): Payer: Medicare Other | Attending: Internal Medicine

## 2015-05-01 ENCOUNTER — Other Ambulatory Visit: Payer: Self-pay

## 2015-05-01 DIAGNOSIS — E785 Hyperlipidemia, unspecified: Secondary | ICD-10-CM

## 2015-05-01 DIAGNOSIS — E119 Type 2 diabetes mellitus without complications: Secondary | ICD-10-CM | POA: Insufficient documentation

## 2015-05-01 DIAGNOSIS — I071 Rheumatic tricuspid insufficiency: Secondary | ICD-10-CM | POA: Insufficient documentation

## 2015-05-01 DIAGNOSIS — N3289 Other specified disorders of bladder: Secondary | ICD-10-CM

## 2015-05-01 DIAGNOSIS — M0579 Rheumatoid arthritis with rheumatoid factor of multiple sites without organ or systems involvement: Secondary | ICD-10-CM

## 2015-05-01 DIAGNOSIS — R000 Tachycardia, unspecified: Secondary | ICD-10-CM

## 2015-05-01 DIAGNOSIS — R0789 Other chest pain: Secondary | ICD-10-CM | POA: Diagnosis not present

## 2015-05-01 DIAGNOSIS — J449 Chronic obstructive pulmonary disease, unspecified: Secondary | ICD-10-CM

## 2015-05-01 DIAGNOSIS — E1159 Type 2 diabetes mellitus with other circulatory complications: Secondary | ICD-10-CM | POA: Diagnosis not present

## 2015-05-01 DIAGNOSIS — I25119 Atherosclerotic heart disease of native coronary artery with unspecified angina pectoris: Secondary | ICD-10-CM

## 2015-05-01 DIAGNOSIS — I2699 Other pulmonary embolism without acute cor pulmonale: Secondary | ICD-10-CM

## 2015-05-01 DIAGNOSIS — I10 Essential (primary) hypertension: Secondary | ICD-10-CM | POA: Insufficient documentation

## 2015-05-01 DIAGNOSIS — I517 Cardiomegaly: Secondary | ICD-10-CM | POA: Insufficient documentation

## 2015-05-01 DIAGNOSIS — R002 Palpitations: Secondary | ICD-10-CM | POA: Diagnosis not present

## 2015-05-01 NOTE — Progress Notes (Signed)
MRN: [id number] Name: Jorge Malone  Sex: male Age: 74 y.o. DOB: June 13, 1940  [place] #: [name] Facility/Room:132 Level Of Care: SNF Provider: Inocencio Homes D Emergency Contacts: Extended Emergency Contact Information Primary Emergency Contact: Solana,Theresa Address: [phone]          [place], [place] [address] [place] Phone: [phone] Relation: Daughter  Code Status:   Allergies: Amoxicillin  Chief Complaint  Patient presents with  . New Admit To SNF    HPI: Patient is 74 y.o. male with diabetes mellitus, rheumatoid arthritis, hyperlipidemia, coronary disease, hypertension and chronic diastolic CHF who presented to ED after a fall, diffuse body aches, right knee pain. He appeared septic and was admitted to [facility] from 11/4-8 with SIRS and subacute vs chronic PE R upper,middle and lower lobe arteries. He was found to have a minimally elevated troponin felt secondary to acute PE with demand ischemia. He is referred now to establish a new Cardiologist after moving here from [place].Pt is being admitted to SNF for residential care as he is not able to comply with his medical treatment at home While at [facility] pt will be followed for HLD, tx with crestor and fenofibrate, HTN, tx with lisinopril. HCTZ and metoprolol and DM2 tx with glucophage.  Past Medical History  Diagnosis Date  . Diabetes mellitus without complication ([place])   . Rheumatoid arthritis ([place])   . High cholesterol   . Coronary artery disease   . Hyperlipidemia 12/13/2014  . Cancer ([place])   . Hypertension   . 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 Pulmonary embolism [facility])     Past Surgical History  Procedure Laterality Date  . Coronary stent placement    . Brain surgery        Medication List       This list is accurate as of: 05/01/15 11:59 PM.  Always use your most recent med list.               apixaban 5 MG Tabs tablet  Commonly known as:  ELIQUIS  Take 2 tablets (10 mg total) by mouth 2 (two)  times daily. For 7 days, then 5 mg twice daily.     budesonide-formoterol 160-4.5 MCG/ACT inhaler  Commonly known as:  SYMBICORT  Inhale 2 puffs into the lungs 2 (two) times daily.     colchicine 0.6 MG tablet  Take 0.6 mg by mouth 2 (two) times daily as needed.     ENBREL SURECLICK 50 MG/ML injection  Generic drug:  etanercept  Inject 50 mg into the skin once a week. On Sundays     fenofibrate 48 MG tablet  Commonly known as:  TRICOR  Take 48 mg by mouth daily.     folic acid 1 MG tablet  Commonly known as:  FOLVITE  Take 1 tablet (1 mg total) by mouth daily.     glucose blood test strip  Commonly known as:  TRUETEST TEST  Check sugars twice per day for E11.9     glucose blood test strip  Use as instructed     hydrochlorothiazide 25 MG tablet  Commonly known as:  HYDRODIURIL  Take 1 tablet (25 mg total) by mouth daily.     HYDROcodone-acetaminophen 5-325 MG tablet  Commonly known as:  NORCO/VICODIN  Take 1-2 tablets by mouth every 4 (four) hours as needed for moderate pain.     levalbuterol 0.63 MG/3ML nebulizer solution  Commonly known as:  XOPENEX  Take 3 mLs (  0.63 mg total) by nebulization every 6 (six) hours as needed for wheezing or shortness of breath. Use 3 times daily x 4 days, then every 6 hours as needed for SOB.     lisinopril 2.5 MG tablet  Commonly known as:  PRINIVIL,ZESTRIL  Take 1 tablet (2.5 mg total) by mouth daily.     metFORMIN 1000 MG tablet  Commonly known as:  GLUCOPHAGE  TAKE 1 TABLET (1,000 MG TOTAL) BY MOUTH DAILY WITH BREAKFAST.     metoprolol tartrate 25 MG tablet  Commonly known as:  LOPRESSOR  Take 1 tablet (25 mg total) by mouth 2 (two) times daily.     multivitamin tablet  Take 1 tablet by mouth daily.     ONE TOUCH BASIC SYSTEM W/DEVICE Kit  TID and QHS     predniSONE 20 MG tablet  Commonly known as:  DELTASONE  Take 2 tablets (40 mg total) by mouth daily. For 3 days then return to 20 mg daily     rosuvastatin 5 MG tablet   Commonly known as:  CRESTOR  Take 5 mg by mouth daily.     SPIRIVA HANDIHALER 18 MCG inhalation capsule  Generic drug:  tiotropium  PLACE 1 CAPSULE INTO INHALER DAILY        No orders of the defined types were placed in this encounter.     There is no immunization history on file for this patient.  Social History  Substance Use Topics  . Smoking status: Former Smoker -- 0.50 packs/day    Types: Cigarettes    Quit date: 12/11/2014  . Smokeless tobacco: Not on file  . Alcohol Use: 0.0 oz/week    0 Standard drinks or equivalent per week     Comment: occasional    Family history is + DM2   Review of Systems  DATA OBTAINED: from patient, nurse, medical record GENERAL:  no fevers, fatigue, appetite changes SKIN: No itching, rash or wounds EYES: No eye pain, redness, discharge EARS: No earache, tinnitus, change in hearing NOSE: No congestion, drainage or bleeding  MOUTH/THROAT: No mouth or tooth pain, No sore throat RESPIRATORY: No cough, wheezing, SOB CARDIAC: No chest pain, palpitations, lower extremity edema  GI: No abdominal pain, No N/V/D or constipation, No heartburn or reflux  GU: No dysuria, frequency or urgency, or incontinence  MUSCULOSKELETAL: No unrelieved bone/joint pain NEUROLOGIC: No headache, dizziness or focal weakness PSYCHIATRIC: No c/o anxiety or sadness   Filed Vitals:   05/01/15 1524  BP: 122/70  Pulse: 74  Temp: 97.2 F (36.2 C)  Resp: 19    SpO2 Readings from Last 1 Encounters:  04/08/15 96%        Physical Exam  GENERAL APPEARANCE: Alert, conversant,  No acute distress.  SKIN: No diaphoresis rash HEAD: Normocephalic, atraumatic  EYES: Conjunctiva/lids clear. Pupils round, reactive. EOMs intact.  EARS: External exam WNL, canals clear. Hearing grossly normal.  NOSE: No deformity or discharge.  MOUTH/THROAT: Lips w/o lesions  RESPIRATORY: Breathing is even, unlabored. Lung sounds are clear   CARDIOVASCULAR: Heart RRR no murmurs,  rubs or gallops. No peripheral edema.   GASTROINTESTINAL: Abdomen is soft, non-tender, not distended w/ normal bowel sounds. GENITOURINARY: Bladder non tender, not distended  MUSCULOSKELETAL: No abnormal joints or musculature NEUROLOGIC:  Cranial nerves 2-12 grossly intact. Moves all extremities  PSYCHIATRIC: Mood and affect appropriate to situation, no behavioral issues  Patient Active Problem List   Diagnosis Date Noted  . Sinus tachycardia ([place]) 04/23/2015  . Atypical  chest pain 04/23/2015  . 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 Pulmonary embolism ([place])   . Acute pulmonary embolism ([place]) 04/05/2015  . Knee effusion 04/05/2015  . Mass of urinary bladder 04/05/2015  . Tobacco abuse 04/05/2015  . Sepsis ([place]) 04/04/2015  . Abdominal pain 04/04/2015  . Right knee pain 04/04/2015  . Lactic acidosis 04/04/2015  . Fall 04/04/2015  . HTN (hypertension) 01/10/2015  . Volume overload 12/14/2014  . Hyperlipidemia 12/13/2014  . COPD (chronic obstructive pulmonary disease) ([place]) 12/12/2014  . Rheumatoid arthritis ([place]) 12/12/2014  . DM2 (diabetes mellitus, type 2) ([place]) 12/12/2014  . CAD (coronary artery disease) 12/12/2014  . Acute respiratory failure with hypoxia (HCC) 12/12/2014    CBC    Component Value Date/Time   WBC 13.7* 04/08/2015 0500   RBC 3.93* 04/08/2015 0500   HGB 11.4* 04/08/2015 0500   HCT 35.5* 04/08/2015 0500   PLT 191 04/08/2015 0500   MCV 90.3 04/08/2015 0500   LYMPHSABS 1.5 04/04/2015 0515   MONOABS 1.1* 04/04/2015 0515   EOSABS 0.1 04/04/2015 0515   BASOSABS 0.0 04/04/2015 0515    CMP     Component Value Date/Time   NA 138 04/05/2015 0123   K 3.8 04/05/2015 0123   CL 108 04/05/2015 0123   CO2 23 04/05/2015 0123   GLUCOSE 108* 04/05/2015 0123   BUN 8 04/05/2015 0123   CREATININE 0.72 04/05/2015 0123   CALCIUM 8.4* 04/05/2015 0123   PROT 6.9 04/04/2015 0515   ALBUMIN 3.1* 04/04/2015 0515   AST 21 04/04/2015 0515   ALT 15* 04/04/2015 0515   ALKPHOS 55  04/04/2015 0515   BILITOT 1.1 04/04/2015 0515   GFRNONAA >60 04/05/2015 0123   GFRAA >60 04/05/2015 0123    Lab Results  Component Value Date   HGBA1C 7.3* 04/04/2015     Ct Angio Chest Pe W/cm &/or Wo Cm  04/04/2015  CLINICAL DATA:  74 year old who fell at home while walking to the bathroom after getting out of bed earlier today. 3-4 day history of right pleuritic chest pain, cough, and right upper quadrant abdominal pain. Laboratory data demonstrates leukocytosis with a [place] of [address] and and elevated lipase of 57. EXAM: CT ANGIOGRAPHY CHEST CT ABDOMEN AND PELVIS WITH CONTRAST TECHNIQUE: Multidetector CT imaging of the chest was performed using the standard protocol during bolus administration of intravenous contrast. Multiplanar CT image reconstructions and MIPs were obtained to evaluate the vascular anatomy. Multidetector CT imaging of the abdomen and pelvis was performed using the standard protocol during bolus administration of intravenous contrast. CONTRAST:  159m OMNIPAQUE IOHEXOL 350 MG/ML IV. COMPARISON:  CTA chest abdomen and pelvis 01/03/2015. FINDINGS: CTA CHEST FINDINGS Contrast opacification of pulmonary arteries is good. Respiratory motion blurs many of the images throughout the chest. Overall, the study is of moderate to good diagnostic quality. Filling defects within branches of the right upper lobe pulmonary artery, though the defects are peripheral and along the vessel wall. Similar peripheral filling defects in the right middle lobe pulmonary artery and a lateral segmental branch of the right lower lobe pulmonary artery. No definite filling defects in the branches of the left pulmonary artery. RV/LV ratio = 1.12. Heart enlarged with evidence of left ventricular hypertrophy. Prominent epicardial fat. Moderate LAD coronary atherosclerosis. No pericardial effusion. Mild atherosclerosis involving the thoracic aorta without aneurysm or dissection. Mild peripheral interstitial  fibrosis involving the right middle lobe and both lower lobes, unchanged. Atelectasis in the lower lobes. Small bilateral pleural effusions. No confluent  airspace consolidation. Scattered small bilateral pleural plaques, noncalcified, unchanged. Numerous normal-sized and upper normal sized mediastinal lymph nodes, the largest a right lower mediastinal (station 8) nodal mass measuring approximately 2.5 x 2.8 cm (previously 2.0 x 2.6 cm. AP window (station 5) lymph nodes have also increased in size since the prior examination. Station 4R and 4L lymph nodes have also increased in size. An enlarged right hilar lymph node measures approximately 2.6 x 2.2 cm, unchanged. Left hilar lymph nodes are normal in size but have increased since the prior examination. No axillary lymphadenopathy. Extensive mucous plugging within the trachea, both mainstem bronchi, with extension into the right upper lobe and left lower lobe bronchi. 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 Generalized osseous demineralization. Review of the MIP images confirms the above findings. CT ABDOMEN and PELVIS FINDINGS Liver normal in size and appearance. Gallbladder normal in appearance without calcified gallstones. No biliary ductal dilation. Pancreas normal in appearance without evidence of mass, ductal dilation, or inflammation. Spleen normal in size and appearance. Adrenal glands normal in appearance. Bilateral renal sinus lipomatosis. Cortical cysts involving both kidneys. Nonobstructing small bilateral renal calculi. No hydronephrosis. No solid renal masses. Approximate 4.0 x 6.3 x 5.6 cm mass involving the right posterolateral wall of the urinary bladder at its base. Stomach normal in appearance for the degree of distention. Normal-appearing small bowel. Distal descending and sigmoid colon diverticulosis without evidence of acute diverticulitis. Remainder of the colon unremarkable with moderate stool burden.  Normal-appearing appendix without evidence of inflammation. No ascites. Severe aortoiliofemoral atherosclerosis without aneurysm. Visceral arteries patent though atherosclerotic. No pathologic lymphadenopathy. Moderate prostate gland enlargement. Normal-appearing seminal vesicles. Penile prosthesis with the inflation device in the right anterior low pelvis adjacent to the urinary bladder. Bone window images demonstrate severe degenerative changes in the left hip, moderate degenerative changes in the right hip, ankylosis of the sacroiliac joints, and multilevel degenerative disc disease, spondylosis and facet degenerative changes throughout the lumbar spine. IMPRESSION: 1. Findings consistent with subacute or chronic pulmonary emboli involving the right upper lobe pulmonary artery, right middle lobe pulmonary artery and branches of the right lower lobe pulmonary artery. 2. Atelectasis involving the lower lobes. Small bilateral pleural effusions. 3. Enlarging mediastinal lymph nodes. Index nodes are measured above. 4. Extensive mucous plugging involving the trachea, bilateral mainstem bronchi, with extension into the right upper lobe and left lower lobe bronchi. 5. Large mass arising from the right posterolateral wall of the urinary bladder at its base. Measurements given above. 6. Distal descending and sigmoid colon diverticulosis without evidence of acute diverticulitis. 7. Osseous findings as above.  No acute osseous abnormalities. Electronically Signed   By: Evangeline Dakin M.D.   On: 04/04/2015 12:16   Ct Abdomen Pelvis W Contrast  04/04/2015  CLINICAL DATA:  74 year old who fell at home while walking to the bathroom after getting out of bed earlier today. 3-4 day history of right pleuritic chest pain, cough, and right upper quadrant abdominal pain. Laboratory data demonstrates leukocytosis with a white blood count of 63335 and and elevated lipase of 57. EXAM: CT ANGIOGRAPHY CHEST CT ABDOMEN AND PELVIS WITH  CONTRAST TECHNIQUE: Multidetector CT imaging of the chest was performed using the standard protocol during bolus administration of intravenous contrast. Multiplanar CT image reconstructions and MIPs were obtained to evaluate the vascular anatomy. Multidetector CT imaging of the abdomen and pelvis was performed using the standard protocol during bolus administration of intravenous contrast. CONTRAST:  166m OMNIPAQUE IOHEXOL 350 MG/ML IV.  COMPARISON:  CTA chest abdomen and pelvis 01/03/2015. FINDINGS: CTA CHEST FINDINGS Contrast opacification of pulmonary arteries is good. Respiratory motion blurs many of the images throughout the chest. Overall, the study is of moderate to good diagnostic quality. Filling defects within branches of the right upper lobe pulmonary artery, though the defects are peripheral and along the vessel wall. Similar peripheral filling defects in the right middle lobe pulmonary artery and a lateral segmental branch of the right lower lobe pulmonary artery. No definite filling defects in the branches of the left pulmonary artery. RV/LV ratio = 1.12. Heart enlarged with evidence of left ventricular hypertrophy. Prominent epicardial fat. Moderate LAD coronary atherosclerosis. No pericardial effusion. Mild atherosclerosis involving the thoracic aorta without aneurysm or dissection. Mild peripheral interstitial fibrosis involving the right middle lobe and both lower lobes, unchanged. Atelectasis in the lower lobes. Small bilateral pleural effusions. No confluent airspace consolidation. Scattered small bilateral pleural plaques, noncalcified, unchanged. Numerous normal-sized and upper normal sized mediastinal lymph nodes, the largest a right lower mediastinal (station 8) nodal mass measuring approximately 2.5 x 2.8 cm (previously 2.0 x 2.6 cm. AP window (station 5) lymph nodes have also increased in size since the prior examination. Station 4R and 4L lymph nodes have also increased in size. An  enlarged right hilar lymph node measures approximately 2.6 x 2.2 cm, unchanged. Left hilar lymph nodes are normal in size but have increased since the prior examination. No axillary lymphadenopathy. Extensive mucous plugging within the trachea, both mainstem bronchi, with extension into the right upper lobe and left lower lobe bronchi. 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 Generalized osseous demineralization. Review of the MIP images confirms the above findings. CT ABDOMEN and PELVIS FINDINGS Liver normal in size and appearance. Gallbladder normal in appearance without calcified gallstones. No biliary ductal dilation. Pancreas normal in appearance without evidence of mass, ductal dilation, or inflammation. Spleen normal in size and appearance. Adrenal glands normal in appearance. Bilateral renal sinus lipomatosis. Cortical cysts involving both kidneys. Nonobstructing small bilateral renal calculi. No hydronephrosis. No solid renal masses. Approximate 4.0 x 6.3 x 5.6 cm mass involving the right posterolateral wall of the urinary bladder at its base. Stomach normal in appearance for the degree of distention. Normal-appearing small bowel. Distal descending and sigmoid colon diverticulosis without evidence of acute diverticulitis. Remainder of the colon unremarkable with moderate stool burden. Normal-appearing appendix without evidence of inflammation. No ascites. Severe aortoiliofemoral atherosclerosis without aneurysm. Visceral arteries patent though atherosclerotic. No pathologic lymphadenopathy. Moderate prostate gland enlargement. Normal-appearing seminal vesicles. Penile prosthesis with the inflation device in the right anterior low pelvis adjacent to the urinary bladder. Bone window images demonstrate severe degenerative changes in the left hip, moderate degenerative changes in the right hip, ankylosis of the sacroiliac joints, and multilevel degenerative disc disease,  spondylosis and facet degenerative changes throughout the lumbar spine. IMPRESSION: 1. Findings consistent with subacute or chronic pulmonary emboli involving the right upper lobe pulmonary artery, right middle lobe pulmonary artery and branches of the right lower lobe pulmonary artery. 2. Atelectasis involving the lower lobes. Small bilateral pleural effusions. 3. Enlarging mediastinal lymph nodes. Index nodes are measured above. 4. Extensive mucous plugging involving the trachea, bilateral mainstem bronchi, with extension into the right upper lobe and left lower lobe bronchi. 5. Large mass arising from the right posterolateral wall of the urinary bladder at its base. Measurements given above. 6. Distal descending and sigmoid colon diverticulosis without evidence of acute diverticulitis. 7. Osseous findings as  above.  No acute osseous abnormalities. Electronically Signed   By: Evangeline Dakin M.D.   On: 04/04/2015 12:16   Dg Chest Port 1 View  04/04/2015  CLINICAL DATA:  Code sepsis. Tachycardia. History COPD, diabetes, coronary artery disease and hypertension. EXAM: PORTABLE CHEST 1 VIEW COMPARISON:  Chest radiograph January 02, 2015 FINDINGS: Cardiac silhouette is mildly enlarged unchanged. Mediastinal silhouette is nonsuspicious. Strandy densities in lung bases, no pleural effusion or focal consolidation. Bilateral midlung zone scarring. No pneumothorax. Soft tissue planes and included osseous structure nonsuspicious. IMPRESSION: Stable cardiomegaly.  Bibasilar atelectasis. Electronically Signed   By: Elon Alas M.D.   On: 04/04/2015 05:44   Dg Knee Complete 4 Views Right  04/04/2015  CLINICAL DATA:  Acute anterior right knee pain for 4 weeks with swelling. EXAM: RIGHT KNEE - COMPLETE 4+ VIEW COMPARISON:  None. FINDINGS: Diffuse soft tissue swelling/subcutaneous edema evident. Normal alignment without acute osseous finding or fracture. Moderately large joint effusion noted on the lateral view.  IMPRESSION: Soft tissue swelling and right knee joint effusion. No acute osseous finding. Electronically Signed   By: Jerilynn Mages.  Shick M.D.   On: 04/04/2015 08:14    Not all labs, radiology exams or other studies done during hospitalization come through on my EPIC note; however they are reviewed by me.    Assessment and Plan  Acute pulmonary embolism ([facility]) SNF - dx during last admission; cont eliquis  HTN (hypertension) SNF - controlled on Lisinopril 2.5 mg daily, HCTZ 25 mg daily and metoprolol which has just been i9ncreased to 25 mg BID  CAD (coronary artery disease) SNF - metoprolol has been increased to 25 BID, pt is on eiquis, so no ASA, pt is on statin, DM is controlled  COPD (chronic obstructive pulmonary disease) ([place]) SNF - pt continues without exacerbation;cont daily spiriva + prns  DM2 (diabetes mellitus, type 2) (HCC) SNF - A1c was 7.4; will follow BS to evaluate a regimen;pt is on ACE and statin  Rheumatoid arthritis ([place]) SNF - pt on prednisone 20 mg daily and embrel;need rheumatology appt as outpt, needs to decrease dose/get off prednisine if possible  Hyperlipidemia SNF - cont fenofibrate and crestor. Now that pt is residential will need FLP.   Mass of urinary bladder SNF -pt is to see urology as outpt   Time spent > 45 min;> 50% of time with patient was spent reviewing records, labs, tests and studies, counseling and developing plan of care  Hennie Duos, MD

## 2015-05-02 ENCOUNTER — Ambulatory Visit: Payer: Medicare Other | Admitting: Cardiology

## 2015-05-03 ENCOUNTER — Encounter: Payer: Self-pay | Admitting: Internal Medicine

## 2015-05-03 NOTE — Assessment & Plan Note (Signed)
SNF - pt continues without exacerbation;cont daily spiriva + prns

## 2015-05-03 NOTE — Assessment & Plan Note (Signed)
SNF - dx during last admission; cont eliquis

## 2015-05-03 NOTE — Assessment & Plan Note (Signed)
SNF - metoprolol has been increased to 25 BID, pt is on eiquis, so no ASA, pt is on statin, DM is controlled

## 2015-05-03 NOTE — Assessment & Plan Note (Signed)
SNF - pt on prednisone 20 mg daily and embrel;need rheumatology appt as outpt, needs to decrease dose/get off prednisine if possible

## 2015-05-03 NOTE — Assessment & Plan Note (Signed)
SNF - A1c was 7.4; will follow BS to evaluate a regimen;pt is on ACE and statin

## 2015-05-03 NOTE — Assessment & Plan Note (Signed)
SNF - cont fenofibrate and crestor. Now that pt is residential will need FLP.

## 2015-05-03 NOTE — Assessment & Plan Note (Signed)
SNF - controlled on Lisinopril 2.5 mg daily, HCTZ 25 mg daily and metoprolol which has just been i9ncreased to 25 mg BID

## 2015-05-03 NOTE — Assessment & Plan Note (Signed)
SNF -pt is to see urology as outpt

## 2015-05-05 ENCOUNTER — Telehealth: Payer: Self-pay | Admitting: *Deleted

## 2015-05-05 MED ORDER — GLUCOSE BLOOD VI STRP: ORAL_STRIP | Status: AC

## 2015-05-05 MED ORDER — GLUCOCOM LANCETS 28G MISC: Status: AC

## 2015-05-05 MED ORDER — TRUE METRIX METER W/DEVICE KIT: PACK | Status: AC

## 2015-05-05 NOTE — Telephone Encounter (Signed)
DPR for daughter Jorge Malone, who has been notified of echo results and findings by phone with verbal understanding. Verified appt for 12/23.

## 2015-05-07 ENCOUNTER — Ambulatory Visit: Payer: Medicare Other | Admitting: Internal Medicine

## 2015-05-12 ENCOUNTER — Encounter: Payer: Self-pay | Admitting: Internal Medicine

## 2015-05-12 ENCOUNTER — Non-Acute Institutional Stay (SKILLED_NURSING_FACILITY): Payer: Medicare Other | Admitting: Internal Medicine

## 2015-05-12 DIAGNOSIS — I951 Orthostatic hypotension: Secondary | ICD-10-CM | POA: Diagnosis not present

## 2015-05-17 ENCOUNTER — Encounter: Payer: Self-pay | Admitting: Internal Medicine

## 2015-05-17 NOTE — Progress Notes (Signed)
MRN: [id number] Name: Jorge Malone  Sex: male Age: 74 y.o. DOB: 11/24/1940  [place] #: [name] Facility/Room:131 Level Of Care: SNF Provider: Inocencio Homes D Emergency Contacts: Extended Emergency Contact Information Primary Emergency Contact: Zipp,Theresa Address: [phone]          [place], [place] [address] [place] Phone: [phone] Relation: Daughter  has taken ho Code Status:   Allergies: Amoxicillin  Chief Complaint  Patient presents with  . Acute Visit    HPI: Patient is 74 y.o. male who  PT has asked me to see. Pt had had some lightheaded feelings while in activity and PT has taken his BP at those times and it has dropped.EX: 129/73 sitting, 90/59 standing;  98/56 seated, 126/74 lying down. Pt admits he felt dizzy once. Denies CP, palpitations, SOB, weakness. No signs or sx of infection like UTI or PNA  Past Medical History  Diagnosis Date  . Diabetes mellitus without complication ([place])   . Rheumatoid arthritis ([place])   . High cholesterol   . Coronary artery disease   . Hyperlipidemia 12/13/2014  . Cancer ([place])   . Hypertension   . 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 Pulmonary embolism [facility])     Past Surgical History  Procedure Laterality Date  . Coronary stent placement    . Brain surgery        Medication List       This list is accurate as of: 05/12/15 11:59 PM.  Always use your most recent med list.               apixaban 5 MG Tabs tablet  Commonly known as:  ELIQUIS  Take 2 tablets (10 mg total) by mouth 2 (two) times daily. For 7 days, then 5 mg twice daily.     budesonide-formoterol 160-4.5 MCG/ACT inhaler  Commonly known as:  SYMBICORT  Inhale 2 puffs into the lungs 2 (two) times daily.     colchicine 0.6 MG tablet  Take 0.6 mg by mouth 2 (two) times daily as needed.     ENBREL SURECLICK 50 MG/ML injection  Generic drug:  etanercept  Inject 50 mg into the skin once a week. On Sundays     fenofibrate 48 MG tablet   Commonly known as:  TRICOR  TAKE 1 TABLET EVERY DAY     folic acid 1 MG tablet  Commonly known as:  FOLVITE  Take 1 tablet (1 mg total) by mouth daily.     GlucoCom Lancets Misc  Check blood sugar TID & QHS     glucose blood test strip  Commonly known as:  TRUETEST TEST  Check sugars twice per day for E11.9     glucose blood test strip  Use as instructed     glucose blood test strip  Use as instructed     hydrochlorothiazide 25 MG tablet  Commonly known as:  HYDRODIURIL  Take 1 tablet (25 mg total) by mouth daily.     HYDROcodone-acetaminophen 5-325 MG tablet  Commonly known as:  NORCO/VICODIN  Take 1-2 tablets by mouth every 4 (four) hours as needed for moderate pain.     levalbuterol 0.63 MG/3ML nebulizer solution  Commonly known as:  XOPENEX  Take 3 mLs (0.63 mg total) by nebulization every 6 (six) hours as needed for wheezing or shortness of breath. Use 3 times daily x 4 days, then every 6 hours as needed for SOB.     lisinopril 2.5 MG tablet  Commonly known as:  PRINIVIL,ZESTRIL  Take 1 tablet (2.5 mg total) by mouth daily.     metFORMIN 1000 MG tablet  Commonly known as:  GLUCOPHAGE  TAKE 1 TABLET (1,000 MG TOTAL) BY MOUTH DAILY WITH BREAKFAST.     metoprolol tartrate 25 MG tablet  Commonly known as:  LOPRESSOR  Take 1 tablet (25 mg total) by mouth 2 (two) times daily.     multivitamin tablet  Take 1 tablet by mouth daily.     ONE TOUCH BASIC SYSTEM W/DEVICE Kit  TID and QHS     TRUE METRIX METER W/DEVICE Kit  Check blood sugar TID & QHS     predniSONE 20 MG tablet  Commonly known as:  DELTASONE  Take 2 tablets (40 mg total) by mouth daily. For 3 days then return to 20 mg daily     rosuvastatin 5 MG tablet  Commonly known as:  CRESTOR  Take 5 mg by mouth daily.     SPIRIVA HANDIHALER 18 MCG inhalation capsule  Generic drug:  tiotropium  PLACE 1 CAPSULE INTO INHALER DAILY        No orders of the defined types were placed in this encounter.      There is no immunization history on file for this patient.  Social History  Substance Use Topics  . Smoking status: Former Smoker -- 0.50 packs/day    Types: Cigarettes    Quit date: 12/11/2014  . Smokeless tobacco: Not on file  . Alcohol Use: 0.0 oz/week    0 Standard drinks or equivalent per week     Comment: occasional    Review of Systems  DATA OBTAINED: from patient, nurse GENERAL:  no fevers, fatigue, appetite changes SKIN: No itching, rash HEENT: No complaint RESPIRATORY: No cough, wheezing, SOB CARDIAC: No chest pain, palpitations, lower extremity edema  GI: No abdominal pain, No N/V/D or constipation, No heartburn or reflux  GU: No dysuria, frequency or urgency, or incontinence  MUSCULOSKELETAL: No unrelieved bone/joint pain NEUROLOGIC: No headache, dizziness once only  PSYCHIATRIC: No overt anxiety or sadness  Filed Vitals:   05/17/15 2107  BP: 132/72  Pulse: 81  Temp: 97.5 F (36.4 C)  Resp: 16    Physical Exam  GENERAL APPEARANCE: Alert, conversant, No acute distress  SKIN: No diaphoresis rash, or wounds HEENT: Unremarkable RESPIRATORY: Breathing is even, unlabored. Lung sounds are clear   CARDIOVASCULAR: Heart RRR no murmurs, rubs or gallops. No peripheral edema  GASTROINTESTINAL: Abdomen is soft, non-tender, not distended w/ normal bowel sounds.  GENITOURINARY: Bladder non tender, not distended  MUSCULOSKELETAL: No abnormal joints or musculature NEUROLOGIC: Cranial nerves 2-12 grossly intact. Moves all extremities PSYCHIATRIC: Mood and affect appropriate to situation, no behavioral issues  Patient Active Problem List   Diagnosis Date Noted  . Sinus tachycardia ([place]) 04/23/2015  . Atypical chest pain 04/23/2015  . 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 Pulmonary embolism ([place])   . Acute pulmonary embolism ([place]) 04/05/2015  . Knee effusion 04/05/2015  . Mass of urinary bladder 04/05/2015  . Tobacco abuse 04/05/2015  . Sepsis ([place]) 04/04/2015  .  Abdominal pain 04/04/2015  . Right knee pain 04/04/2015  . Lactic acidosis 04/04/2015  . Fall 04/04/2015  . HTN (hypertension) 01/10/2015  . Volume overload 12/14/2014  . Hyperlipidemia 12/13/2014  . COPD (chronic obstructive pulmonary disease) ([place]) 12/12/2014  . Rheumatoid arthritis ([place]) 12/12/2014  . DM2 (diabetes mellitus, type 2) ([place]) 12/12/2014  . CAD (coronary artery disease) 12/12/2014  . Acute  respiratory failure with hypoxia ([facility]) 12/12/2014    CBC    Component Value Date/Time   WBC 13.7* 04/08/2015 0500   RBC 3.93* 04/08/2015 0500   HGB 11.4* 04/08/2015 0500   HCT 35.5* 04/08/2015 0500   PLT 191 04/08/2015 0500   MCV 90.3 04/08/2015 0500   LYMPHSABS 1.5 04/04/2015 0515   MONOABS 1.1* 04/04/2015 0515   EOSABS 0.1 04/04/2015 0515   BASOSABS 0.0 04/04/2015 0515    CMP     Component Value Date/Time   NA 138 04/05/2015 0123   K 3.8 04/05/2015 0123   CL 108 04/05/2015 0123   CO2 23 04/05/2015 0123   GLUCOSE 108* 04/05/2015 0123   BUN 8 04/05/2015 0123   CREATININE 0.72 04/05/2015 0123   CALCIUM 8.4* 04/05/2015 0123   PROT 6.9 04/04/2015 0515   ALBUMIN 3.1* 04/04/2015 0515   AST 21 04/04/2015 0515   ALT 15* 04/04/2015 0515   ALKPHOS 55 04/04/2015 0515   BILITOT 1.1 04/04/2015 0515   GFRNONAA >60 04/05/2015 0123   GFRAA >60 04/05/2015 0123    Assessment and Plan  ORTHOSTATIC HYPOTENSION- reviewed pt's history and meds;pt is on 2 BP meds will d/c the HCTZ; ordered a CBC and BMP for possible other causes.  Hennie Duos, MD

## 2015-05-17 NOTE — Progress Notes (Signed)
This encounter was created in error - please disregard.

## 2015-05-22 ENCOUNTER — Other Ambulatory Visit: Payer: Self-pay | Admitting: Internal Medicine

## 2015-05-22 ENCOUNTER — Encounter: Payer: Self-pay | Admitting: Physician Assistant

## 2015-05-22 NOTE — Progress Notes (Signed)
Cardiology Office Note Date:  05/23/2015  Patient ID:  Jorge Malone, Jorge Malone 01-01-1941, MRN [id number] PCP:  Lance Bosch, NP  Cardiologist:  Dr. Radford Pax  Chief Complaint: f/u CP, elevated HR  History of Present Illness: Jorge Malone is a 74 y.o. male with history of CAD s/p remote stenting in [place], diabetes mellitus, rheumatoid arthritis, hyperlipidemia, hypertension, orthostatic hypotension, chronic diastolic CHF, PE who presents for follow-up. In 04/2015 he was admitted to [facility] with SIRS and subacute vs chronic PE R upper,middle and lower lobe arteries. He was found to have a minimally elevated troponin felt secondary to acute PE with demand ischemia.CT showed large bladder mass and he has been referred to urology. He established care with Dr. Radford Pax last month and was reporting brief sharp CP and increased dyspnea as well as as sinus tach. He had not taken his metoprolol that day. Dr. Radford Pax did not feel this was related to ischemia and wished to avoid stress testing given recent PEs. Labs were ordered (TSH, CBC, BMET) but he did not have these done. Echo 04/23/15 was limited, normal EF, mod LVH, normal CVP, only trivial posterior pericardial effusion. A repeat echo 05/01/15: mod LVH, EF 60-65%, grade 1 DD, normal CVP (no change from prior).   He was asked to f/u in 1 week but it doesn't appear this happened. He was seen at the nursing home 12/12 for orthostatic hypotension - HCTZ was discontinued per their notes but the patient says he is still receiving this at the SNF he is at. He denies any recurrent issues with BP dropping. He says he's actually doing very well. His chest pain has resolved. He denies any significant dyspnea. He is participating in rehab 2x/day. Per his daughter the plan is for him to get stronger with rehab before seeing urology for his bladder mass.   Past Medical History  Diagnosis Date  . Diabetes mellitus ([place])   . Rheumatoid arthritis ([place])   . Coronary artery  disease     a. s/p remote stenting in [place].  Marland Kitchen Hyperlipidemia 12/13/2014  . Cancer ([place])   . Hypertension   . 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 Pulmonary embolism ([place])     a. 04/2015 he was admitted to [facility] with SIRS and subacute vs chronic PE R upper,middle and lower lobe arteries.  . Orthostatic hypotension   . Chronic diastolic CHF (congestive heart failure) [facility])     Past Surgical History  Procedure Laterality Date  . Coronary stent placement    . Brain surgery      Current Outpatient Prescriptions  Medication Sig Dispense Refill  . apixaban (ELIQUIS) 5 MG TABS tablet Take 2 tablets (10 mg total) by mouth 2 (two) times daily. For 7 days, then 5 mg twice daily. 60 tablet   . Blood Glucose Monitoring Suppl (ONE TOUCH BASIC SYSTEM) W/DEVICE KIT TID and QHS 1 each 0  . Blood Glucose Monitoring Suppl (TRUE METRIX METER) W/DEVICE KIT Check blood sugar TID & QHS 1 kit 0  . budesonide-formoterol (SYMBICORT) 160-4.5 MCG/ACT inhaler Inhale 2 puffs into the lungs 2 (two) times daily.    . colchicine 0.6 MG tablet Take 0.6 mg by mouth 2 (two) times daily as needed.  1  . etanercept (ENBREL SURECLICK) 50 MG/ML injection Inject 50 mg into the skin once a week. On Sundays    . fenofibrate (TRICOR) 48 MG tablet TAKE 1 TABLET EVERY DAY 30 tablet 0  . folic acid (FOLVITE) 1 MG  tablet Take 1 tablet (1 mg total) by mouth daily. 30 tablet 2  . GlucoCom Lancets MISC Check blood sugar TID & QHS 100 each 5  . glucose blood (TRUETEST TEST) test strip Check sugars twice per day for E11.9 100 each 12  . glucose blood test strip Use as instructed 100 each 12  . glucose blood test strip Use as instructed 100 each 12  . hydrochlorothiazide (HYDRODIURIL) 25 MG tablet Take 1 tablet (25 mg total) by mouth daily. 30 tablet 3  . HYDROcodone-acetaminophen (NORCO/VICODIN) 5-325 MG tablet Take 1-2 tablets by mouth every 4 (four) hours as needed for moderate pain. (Patient taking differently: Take 1 tablet by mouth every 6 (six)  hours as needed for moderate pain. ) 30 tablet 0  . levalbuterol (XOPENEX) 0.63 MG/3ML nebulizer solution Take 3 mLs (0.63 mg total) by nebulization every 6 (six) hours as needed for wheezing or shortness of breath. Use 3 times daily x 4 days, then every 6 hours as needed for SOB. 360 mL 3  . lisinopril (PRINIVIL,ZESTRIL) 2.5 MG tablet Take 1 tablet (2.5 mg total) by mouth daily. 90 tablet 0  . metFORMIN (GLUCOPHAGE) 1000 MG tablet TAKE 1 TABLET (1,000 MG TOTAL) BY MOUTH DAILY WITH BREAKFAST. 60 tablet 2  . metoprolol tartrate (LOPRESSOR) 25 MG tablet Take 1 tablet (25 mg total) by mouth 2 (two) times daily. 60 tablet 6  . Multiple Vitamin (MULTIVITAMIN) tablet Take 1 tablet by mouth daily.    . predniSONE (DELTASONE) 20 MG tablet Take 2 tablets (40 mg total) by mouth daily. For 3 days then return to 20 mg daily 10 tablet 0  . rosuvastatin (CRESTOR) 5 MG tablet Take 5 mg by mouth daily.    Marland Kitchen SPIRIVA HANDIHALER 18 MCG inhalation capsule PLACE 1 CAPSULE INTO INHALER DAILY 30 capsule 2   No current facility-administered medications for this visit.    Allergies:   Amoxicillin   Social History:  The patient  reports that he quit smoking about 5 months ago. His smoking use included Cigarettes. He smoked 0.50 packs per day. He has never used smokeless tobacco. He reports that he drinks alcohol. He reports that he does not use illicit drugs.   Family History:  The patient's family history includes Diabetes in his mother and other; Healthy in his brother, father, sister, sister, sister, and sister; Heart disease in his brother; Lupus in his brother; Sudden death in his brother.  ROS:  Please see the history of present illness.  All other systems are reviewed and otherwise negative.   PHYSICAL EXAM:  VS:  BP 140/70 mmHg  Pulse 91  Ht _0  (1.753 m)  Wt 214 lb 12.8 oz (97.433 kg)  BMI 31.71 kg/m2  SpO2 98% BMI: Body mass index is 31.71 kg/(m^2). Well nourished, well developed AAM, in no acute  distress HEENT: normocephalic, atraumatic Neck: no JVD, carotid bruits or masses Cardiac:  normal S1, S2; RRR with occasional irregularlity; no murmurs, rubs, or gallops Lungs:  clear to auscultation bilaterally, no wheezing, rhonchi or rales Abd: soft, nontender, no hepatomegaly, + BS MS: no deformity or atrophy Ext: trace pedal edema Skin: warm and dry, no rash Neuro:  moves all extremities spontaneously, no focal abnormalities noted, follows commands Psych: euthymic mood, full affect  EKG:  Done today shows NSR with sinus arrhythmia, 92bpm, no acute changes  Recent Labs: 04/04/2015: ALT 15* 04/05/2015: BUN 8; Creatinine, Ser 0.72; Potassium 3.8; Sodium 138 04/08/2015: Hemoglobin 11.4*; Platelets 191  No  results found for requested labs within last 365 days.   CrCl cannot be calculated (Patient has no serum creatinine result on file.).   Wt Readings from Last 3 Encounters:  05/23/15 214 lb 12.8 oz (97.433 kg)  04/23/15 216 lb (97.977 kg)  04/04/15 225 lb 9.6 oz (102.331 kg)     Other studies reviewed: Additional studies/records reviewed today include: summarized above  ASSESSMENT AND PLAN:  1. Chest pain, in the setting of PE and CAD s/p remote PCI - chest pain has resolved. Continue observation for symptoms. Continue BB, statin for CAD. Not on ASA because he is on Eliquis with recent PE. 2. Essential HTN with recent orthostatic hypotension - see above regarding HCTZ. Will not make any med changes at this time. 3. H/o PE, now on apixaban - with bladder mass on CT. Per daughter, getting stronger with rehab before proceeding with urology workup. No bleeding reported. 4. Chronic diastolic CHF - appears generally euvolemic. 5. Sinus tachycardia - improved. HR sounded irregular on exam but EKG confirms NSR with sinus arrhythmia. Will update the labs that were previously recommended (BMET, CBC, TSH).  Disposition: F/u with Dr. Radford Pax in 3 months.  Current medicines are reviewed at  length with the patient today.  The patient did not have any concerns regarding medicines.  Raechel Ache PA-C 05/23/2015 9:46 AM     [facility] [place] [address] [phone] (office)  [phone] (fax)

## 2015-05-23 ENCOUNTER — Ambulatory Visit (INDEPENDENT_AMBULATORY_CARE_PROVIDER_SITE_OTHER): Payer: Medicare Other | Admitting: Physician Assistant

## 2015-05-23 ENCOUNTER — Encounter: Payer: Self-pay | Admitting: Physician Assistant

## 2015-05-23 VITALS — BP 140/70 | HR 91 | Ht 69.0 in | Wt 214.8 lb

## 2015-05-23 DIAGNOSIS — I2510 Atherosclerotic heart disease of native coronary artery without angina pectoris: Secondary | ICD-10-CM

## 2015-05-23 DIAGNOSIS — I10 Essential (primary) hypertension: Secondary | ICD-10-CM | POA: Diagnosis not present

## 2015-05-23 DIAGNOSIS — I5032 Chronic diastolic (congestive) heart failure: Secondary | ICD-10-CM

## 2015-05-23 DIAGNOSIS — Z9861 Coronary angioplasty status: Secondary | ICD-10-CM

## 2015-05-23 DIAGNOSIS — I951 Orthostatic hypotension: Secondary | ICD-10-CM

## 2015-05-23 DIAGNOSIS — R079 Chest pain, unspecified: Secondary | ICD-10-CM

## 2015-05-23 DIAGNOSIS — R000 Tachycardia, unspecified: Secondary | ICD-10-CM

## 2015-05-23 DIAGNOSIS — Z86711 Personal history of pulmonary embolism: Secondary | ICD-10-CM

## 2015-05-23 LAB — BASIC METABOLIC PANEL
BUN: 9 mg/dL (ref 7–25)
CALCIUM: 9.5 mg/dL (ref 8.6–10.3)
CO2: 25 mmol/L (ref 20–31)
Chloride: 107 mmol/L (ref 98–110)
Creat: 0.86 mg/dL (ref 0.70–1.18)
Glucose, Bld: 89 mg/dL (ref 65–99)
Potassium: 3.2 mmol/L — ABNORMAL LOW (ref 3.5–5.3)
SODIUM: 145 mmol/L (ref 135–146)

## 2015-05-23 LAB — CBC WITH DIFFERENTIAL/PLATELET
BASOS ABS: 0 10*3/uL (ref 0.0–0.1)
BASOS PCT: 0 % (ref 0–1)
Eosinophils Absolute: 0.2 10*3/uL (ref 0.0–0.7)
Eosinophils Relative: 1 % (ref 0–5)
HCT: 34.8 % — ABNORMAL LOW (ref 39.0–52.0)
HEMOGLOBIN: 11.9 g/dL — AB (ref 13.0–17.0)
Lymphocytes Relative: 16 % (ref 12–46)
Lymphs Abs: 2.7 10*3/uL (ref 0.7–4.0)
MCH: 29.9 pg (ref 26.0–34.0)
MCHC: 34.2 g/dL (ref 30.0–36.0)
MCV: 87.4 fL (ref 78.0–100.0)
MONO ABS: 1.7 10*3/uL — AB (ref 0.1–1.0)
MPV: 9.7 fL (ref 8.6–12.4)
Monocytes Relative: 10 % (ref 3–12)
NEUTROS ABS: 12.4 10*3/uL — AB (ref 1.7–7.7)
NEUTROS PCT: 73 % (ref 43–77)
Platelets: 344 10*3/uL (ref 150–400)
RBC: 3.98 MIL/uL — AB (ref 4.22–5.81)
RDW: 14.4 % (ref 11.5–15.5)
WBC: 17 10*3/uL — AB (ref 4.0–10.5)

## 2015-05-23 LAB — TSH: TSH: 1.28 u[IU]/mL (ref 0.350–4.500)

## 2015-05-23 NOTE — Patient Instructions (Signed)
Medication Instructions:  Your physician recommends that you continue on your current medications as directed. Please refer to the Current Medication list given to you today.   Labwork: TODAY BMET, CBC W./DIFF, TSH  Testing/Procedures: NONE  Follow-Up: 08/25/15 @ 9:45 WITH DR. Radford Pax   Any Other Special Instructions Will Be Listed Below (If Applicable).   If you need a refill on your cardiac medications before your next appointment, please call your pharmacy.

## 2015-05-28 ENCOUNTER — Other Ambulatory Visit: Payer: Self-pay

## 2015-05-28 MED ORDER — ROSUVASTATIN CALCIUM 5 MG PO TABS: 5.0000 mg | ORAL_TABLET | Freq: Every day | ORAL | Status: DC

## 2015-05-28 MED ORDER — LISINOPRIL 2.5 MG PO TABS: 2.5000 mg | ORAL_TABLET | Freq: Every day | ORAL | Status: DC

## 2015-05-28 MED ORDER — FENOFIBRATE 48 MG PO TABS: 48.0000 mg | ORAL_TABLET | Freq: Every day | ORAL | Status: AC

## 2015-05-28 MED ORDER — METFORMIN HCL 1000 MG PO TABS: ORAL_TABLET | ORAL | Status: DC

## 2015-05-28 NOTE — Telephone Encounter (Signed)
Nurse received message stating prescription for fenofibrate did not go through electronically.  Nurse called pharmacy, pharmacy did not receive electronic refill request. Nurse will send request to Nicoletta Ba due to drug-drug reaction when nurse attempted to resend prescription.

## 2015-05-28 NOTE — Addendum Note (Signed)
Addended by: Nicoletta Ba A on: 05/28/2015 11:45 AM   Modules accepted: Orders

## 2015-06-10 ENCOUNTER — Non-Acute Institutional Stay (SKILLED_NURSING_FACILITY): Payer: Medicare Other | Admitting: Adult Health

## 2015-06-10 ENCOUNTER — Encounter: Payer: Self-pay | Admitting: Adult Health

## 2015-06-10 DIAGNOSIS — M0579 Rheumatoid arthritis with rheumatoid factor of multiple sites without organ or systems involvement: Secondary | ICD-10-CM | POA: Diagnosis not present

## 2015-06-10 DIAGNOSIS — I25119 Atherosclerotic heart disease of native coronary artery with unspecified angina pectoris: Secondary | ICD-10-CM

## 2015-06-10 DIAGNOSIS — I2699 Other pulmonary embolism without acute cor pulmonale: Secondary | ICD-10-CM

## 2015-06-10 DIAGNOSIS — J449 Chronic obstructive pulmonary disease, unspecified: Secondary | ICD-10-CM | POA: Diagnosis not present

## 2015-06-10 MED ORDER — HYDROCODONE-ACETAMINOPHEN 5-325 MG PO TABS: 1.0000 | ORAL_TABLET | ORAL | Status: AC | PRN

## 2015-06-10 NOTE — Progress Notes (Signed)
Patient ID: Jorge Malone, male   DOB: [birth year], 75 y.o.   MRN: [id number]    Facility:  [facility]      Allergies  Allergen Reactions  . Amoxicillin Nausea Only and Other (See Comments)    GI problems Pt tolerates cephalosporins    Chief Complaint  Patient presents with  . Discharge Note    HPI:  He is being discharged to home with home health for pt/ot. He will need a 3:1 commode. He will need his prescriptions to be written and will need to follow up with his pcp.  He had been hospitalized for an acute PE. He was admitted to this facility for short term rehab.    Past Medical History  Diagnosis Date  . Diabetes mellitus ([place])   . Rheumatoid arthritis ([place])   . Coronary artery disease     a. s/p remote stenting in [place].  Marland Kitchen Hyperlipidemia 12/13/2014  . Cancer ([place])   . Hypertension   . 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 Pulmonary embolism ([place])     a. 04/2015 he was admitted to [facility] with SIRS and subacute vs chronic PE R upper,middle and lower lobe arteries.  . Orthostatic hypotension   . Chronic diastolic CHF (congestive heart failure) [facility])     Past Surgical History  Procedure Laterality Date  . Coronary stent placement    . Brain surgery      VITAL SIGNS BP 106/75 mmHg  Pulse 100  Ht _0  (1.753 m)  Wt 215 lb (97.523 kg)  BMI 31.74 kg/m2  SpO2 98%  Patient's Medications  New Prescriptions   No medications on file  Previous Medications   APIXABAN (ELIQUIS) 5 MG TABS TABLET    Take 2 tablets (10 mg total) by mouth 2 (two) times daily. For 7 days, then 5 mg twice daily.   BLOOD GLUCOSE MONITORING SUPPL (ONE TOUCH BASIC SYSTEM) W/DEVICE KIT    TID and QHS   BLOOD GLUCOSE MONITORING SUPPL (TRUE METRIX METER) W/DEVICE KIT    Check blood sugar TID & QHS   BUDESONIDE-FORMOTEROL (SYMBICORT) 160-4.5 MCG/ACT INHALER    Inhale 2 puffs into the lungs 2 (two) times daily.   COLCHICINE 0.6 MG TABLET    Take 0.6 mg by mouth 2 (two) times daily as needed.   ETANERCEPT (ENBREL  SURECLICK) 50 MG/ML INJECTION    Inject 50 mg into the skin once a week. On Sundays   FENOFIBRATE (TRICOR) 48 MG TABLET    Take 1 tablet (48 mg total) by mouth daily.   FOLIC ACID (FOLVITE) 1 MG TABLET    Take 1 tablet (1 mg total) by mouth daily.   GLUCOCOM LANCETS MISC    Check blood sugar TID & QHS   GLUCOSE BLOOD (TRUETEST TEST) TEST STRIP    Check sugars twice per day for E11.9   GLUCOSE BLOOD TEST STRIP    Use as instructed   GLUCOSE BLOOD TEST STRIP    Use as instructed   HYDROCHLOROTHIAZIDE (HYDRODIURIL) 25 MG TABLET    Take 1 tablet (25 mg total) by mouth daily.   HYDROCODONE-ACETAMINOPHEN (NORCO/VICODIN) 5-325 MG TABLET    Take 1-2 tablets by mouth every 4 (four) hours as needed for moderate pain.   LEVALBUTEROL (XOPENEX) 0.63 MG/3ML NEBULIZER SOLUTION    Take 3 mLs (0.63 mg total) by nebulization every 6 (six) hours as needed for wheezing or shortness of breath. Use 3 times daily x 4 days, then every 6 hours as needed  for SOB.   LISINOPRIL (PRINIVIL,ZESTRIL) 2.5 MG TABLET    Take 1 tablet (2.5 mg total) by mouth daily.   METFORMIN (GLUCOPHAGE) 1000 MG TABLET    TAKE 1 TABLET (1,000 MG TOTAL) BY MOUTH DAILY WITH BREAKFAST.   METOPROLOL TARTRATE (LOPRESSOR) 25 MG TABLET    Take 1 tablet (25 mg total) by mouth 2 (two) times daily.   MULTIPLE VITAMIN (MULTIVITAMIN) TABLET    Take 1 tablet by mouth daily.   PREDNISONE (DELTASONE) 20 MG TABLET    Take 2 tablets (40 mg total) by mouth daily. For 3 days then return to 20 mg daily   ROSUVASTATIN (CRESTOR) 5 MG TABLET    Take 1 tablet (5 mg total) by mouth daily.   SPIRIVA HANDIHALER 18 MCG INHALATION CAPSULE    PLACE 1 CAPSULE INTO INHALER DAILY  Modified Medications   No medications on file  Discontinued Medications   No medications on file     SIGNIFICANT DIAGNOSTIC EXAMS  04-04-15: ct angio of chest and ct of abdomen and pelvis: 1. Findings consistent with subacute or chronic pulmonary emboli involving the right upper lobe pulmonary  artery, right middle lobe pulmonary artery and branches of the right lower lobe pulmonary artery. 2. Atelectasis involving the lower lobes. Small bilateral pleural effusions. 3. Enlarging mediastinal lymph nodes. Index nodes are measured above. 4. Extensive mucous plugging involving the trachea, bilateral mainstem bronchi, with extension into the right upper lobe and left lower lobe bronchi. 5. Large mass arising from the right posterolateral wall of the urinary bladder at its base  Measurements given above. 6. Distal descending and sigmoid colon diverticulosis without evidence of acute diverticulitis. 7. Osseous findings as above.  No acute osseous abnormalities.    LABS REVIEWED:   05-23-15: wbc 17.0; hgb 11.9; hct 34.8; mcv 87.4; plt 344; glucose 89; bun 9; creat 0.86; k+ 3.2; na++145     Review of Systems  Constitutional: Negative for malaise/fatigue.  Respiratory: Negative for cough and shortness of breath.   Cardiovascular: Negative for chest pain, palpitations and leg swelling.  Gastrointestinal: Negative for heartburn, abdominal pain and constipation.  Musculoskeletal: Negative for myalgias and joint pain.  Skin: Negative.   Neurological: Negative for dizziness.  Psychiatric/Behavioral: The patient is not nervous/anxious.     Physical Exam  Constitutional: He is oriented to person, place, and time. No distress.  Overweight   Eyes: Conjunctivae are normal.  Neck: Neck supple. No JVD present. No thyromegaly present.  Cardiovascular: Normal rate, regular rhythm and intact distal pulses.   Respiratory: Effort normal and breath sounds normal. No respiratory distress. He has no wheezes.  GI: Soft. Bowel sounds are normal. He exhibits no distension. There is no tenderness.  Musculoskeletal: He exhibits no edema.  Able to move all extremities   Lymphadenopathy:    He has no cervical adenopathy.  Neurological: He is alert and oriented to person, place, and time.  Skin: Skin is  warm and dry. He is not diaphoretic.  Psychiatric: He has a normal mood and affect.      ASSESSMENT/ PLAN:  Will discharge to home with home health for pt/ot to evaluate and treat as indicated  for gait, strength, adl training. He will need a 3:1 commode. His prescriptions have been written for a 30 day supply of his medications with vicodin 5/325 mg #30 tabs. He has a follow up with Dr. Feliciana Rossetti on 06-16-15 at 2 pm.     Time spent with patient 40   minutes >  50% time spent counseling; reviewing medical record; tests; labs; and developing future plan of care    Ok Edwards NP [facility]  Contact [phone] Monday through Friday 8am- 5pm  After hours call [phone]

## 2015-06-12 DIAGNOSIS — I10 Essential (primary) hypertension: Secondary | ICD-10-CM | POA: Diagnosis not present

## 2015-06-12 DIAGNOSIS — M05861 Other rheumatoid arthritis with rheumatoid factor of right knee: Secondary | ICD-10-CM | POA: Diagnosis not present

## 2015-06-12 DIAGNOSIS — I2601 Septic pulmonary embolism with acute cor pulmonale: Secondary | ICD-10-CM | POA: Diagnosis not present

## 2015-06-12 DIAGNOSIS — E119 Type 2 diabetes mellitus without complications: Secondary | ICD-10-CM | POA: Diagnosis not present

## 2015-06-12 DIAGNOSIS — I2510 Atherosclerotic heart disease of native coronary artery without angina pectoris: Secondary | ICD-10-CM | POA: Diagnosis not present

## 2015-06-12 DIAGNOSIS — M1A3790 Chronic gout due to renal impairment, unspecified ankle and foot, without tophus (tophi): Secondary | ICD-10-CM | POA: Diagnosis not present

## 2015-06-16 ENCOUNTER — Other Ambulatory Visit: Payer: Self-pay | Admitting: Internal Medicine

## 2015-06-16 ENCOUNTER — Encounter: Payer: Self-pay | Admitting: Internal Medicine

## 2015-06-16 ENCOUNTER — Ambulatory Visit: Payer: Medicare Other | Attending: Internal Medicine | Admitting: Internal Medicine

## 2015-06-16 VITALS — BP 166/90 | HR 83 | Temp 98.5°F | Resp 17 | Ht 69.0 in | Wt 215.0 lb

## 2015-06-16 DIAGNOSIS — N329 Bladder disorder, unspecified: Secondary | ICD-10-CM | POA: Diagnosis not present

## 2015-06-16 DIAGNOSIS — I10 Essential (primary) hypertension: Secondary | ICD-10-CM

## 2015-06-16 DIAGNOSIS — N3289 Other specified disorders of bladder: Secondary | ICD-10-CM

## 2015-06-16 DIAGNOSIS — I2510 Atherosclerotic heart disease of native coronary artery without angina pectoris: Secondary | ICD-10-CM | POA: Diagnosis not present

## 2015-06-16 DIAGNOSIS — I5032 Chronic diastolic (congestive) heart failure: Secondary | ICD-10-CM | POA: Insufficient documentation

## 2015-06-16 DIAGNOSIS — I2699 Other pulmonary embolism without acute cor pulmonale: Secondary | ICD-10-CM

## 2015-06-16 DIAGNOSIS — Z859 Personal history of malignant neoplasm, unspecified: Secondary | ICD-10-CM | POA: Insufficient documentation

## 2015-06-16 DIAGNOSIS — I951 Orthostatic hypotension: Secondary | ICD-10-CM | POA: Insufficient documentation

## 2015-06-16 DIAGNOSIS — E119 Type 2 diabetes mellitus without complications: Secondary | ICD-10-CM | POA: Diagnosis not present

## 2015-06-16 DIAGNOSIS — Z7984 Long term (current) use of oral hypoglycemic drugs: Secondary | ICD-10-CM | POA: Diagnosis not present

## 2015-06-16 DIAGNOSIS — Z87891 Personal history of nicotine dependence: Secondary | ICD-10-CM | POA: Diagnosis not present

## 2015-06-16 DIAGNOSIS — M069 Rheumatoid arthritis, unspecified: Secondary | ICD-10-CM | POA: Insufficient documentation

## 2015-06-16 DIAGNOSIS — E1159 Type 2 diabetes mellitus with other circulatory complications: Secondary | ICD-10-CM | POA: Diagnosis not present

## 2015-06-16 DIAGNOSIS — Z880 Allergy status to penicillin: Secondary | ICD-10-CM | POA: Insufficient documentation

## 2015-06-16 DIAGNOSIS — Z79899 Other long term (current) drug therapy: Secondary | ICD-10-CM | POA: Diagnosis not present

## 2015-06-16 DIAGNOSIS — Z7901 Long term (current) use of anticoagulants: Secondary | ICD-10-CM | POA: Diagnosis not present

## 2015-06-16 DIAGNOSIS — E785 Hyperlipidemia, unspecified: Secondary | ICD-10-CM | POA: Diagnosis not present

## 2015-06-16 DIAGNOSIS — R296 Repeated falls: Secondary | ICD-10-CM

## 2015-06-16 DIAGNOSIS — Z86711 Personal history of pulmonary embolism: Secondary | ICD-10-CM | POA: Diagnosis not present

## 2015-06-16 MED ORDER — ROSUVASTATIN CALCIUM 5 MG PO TABS: 5.0000 mg | ORAL_TABLET | Freq: Every day | ORAL | Status: AC

## 2015-06-16 MED ORDER — APIXABAN 5 MG PO TABS: 5.0000 mg | ORAL_TABLET | Freq: Two times a day (BID) | ORAL | Status: AC

## 2015-06-16 MED ORDER — LISINOPRIL 2.5 MG PO TABS: 2.5000 mg | ORAL_TABLET | Freq: Every day | ORAL | Status: AC

## 2015-06-16 MED ORDER — HYDROCHLOROTHIAZIDE 25 MG PO TABS: 25.0000 mg | ORAL_TABLET | Freq: Every day | ORAL | Status: AC

## 2015-06-16 MED ORDER — METFORMIN HCL 1000 MG PO TABS: ORAL_TABLET | ORAL | Status: AC

## 2015-06-16 NOTE — Progress Notes (Signed)
Patient ID: Jorge Malone, male   DOB: 1940/10/20, 75 y.o.   MRN: [id number]  CC: f/u  HPI: Jorge Malone is a 75 y.o. male with history of CAD s/p remote stenting in [place], diabetes mellitus, rheumatoid arthritis, hyperlipidemia, hypertension, orthostatic hypotension, chronic diastolic CHF, PE who presents for follow-up. In 04/2015 he was admitted to [facility] with SIRS and subacute vs chronic PE R upper,middle and lower lobe arteries. He was found to have a minimally elevated troponin felt secondary to acute PE with demand ischemia.CT showed large bladder mass and he has been referred to urology. Patient was just discharged from the rehab facility 6 days ago. Now receiving PT 3 times per week through home health. His daughter states that he needs help out of the bed, weak, and unsteady. She feels he needs 24 hour care due to his multiple falls before going into the nursing home. He denies falls since being discharged.He has plans to move back to [place] next month in a assisted living.  He takes Eliquis as directed daily. He denies bleeding.   Allergies  Allergen Reactions  . Amoxicillin Nausea Only and Other (See Comments)    GI problems Pt tolerates cephalosporins   Past Medical History  Diagnosis Date  . Diabetes mellitus ([place])   . Rheumatoid arthritis ([place])   . Coronary artery disease     a. s/p remote stenting in [place].  Marland Kitchen Hyperlipidemia 12/13/2014  . Cancer ([place])   . Hypertension   . 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 Pulmonary embolism ([place])     a. 04/2015 he was admitted to [facility] with SIRS and subacute vs chronic PE R upper,middle and lower lobe arteries.  . Orthostatic hypotension   . Chronic diastolic CHF (congestive heart failure) ([place])    Current Outpatient Prescriptions on File Prior to Visit  Medication Sig Dispense Refill  . apixaban (ELIQUIS) 5 MG TABS tablet Take 2 tablets (10 mg total) by mouth 2 (two) times daily. For 7 days, then 5 mg twice daily. 60 tablet   . budesonide-formoterol  (SYMBICORT) 160-4.5 MCG/ACT inhaler Inhale 2 puffs into the lungs 2 (two) times daily.    . colchicine 0.6 MG tablet Take 0.6 mg by mouth 2 (two) times daily as needed.  1  . fenofibrate (TRICOR) 48 MG tablet Take 1 tablet (48 mg total) by mouth daily. 30 tablet 0  . folic acid (FOLVITE) 1 MG tablet Take 1 tablet (1 mg total) by mouth daily. 30 tablet 2  . hydrochlorothiazide (HYDRODIURIL) 25 MG tablet Take 1 tablet (25 mg total) by mouth daily. 30 tablet 3  . HYDROcodone-acetaminophen (NORCO/VICODIN) 5-325 MG tablet Take 1-2 tablets by mouth every 4 (four) hours as needed for moderate pain. 30 tablet 0  . levalbuterol (XOPENEX) 0.63 MG/3ML nebulizer solution Take 3 mLs (0.63 mg total) by nebulization every 6 (six) hours as needed for wheezing or shortness of breath. Use 3 times daily x 4 days, then every 6 hours as needed for SOB. 360 mL 3  . lisinopril (PRINIVIL,ZESTRIL) 2.5 MG tablet Take 1 tablet (2.5 mg total) by mouth daily. 90 tablet 1  . metFORMIN (GLUCOPHAGE) 1000 MG tablet TAKE 1 TABLET (1,000 MG TOTAL) BY MOUTH DAILY WITH BREAKFAST. 90 tablet 2  . metoprolol tartrate (LOPRESSOR) 25 MG tablet Take 1 tablet (25 mg total) by mouth 2 (two) times daily. 60 tablet 6  . Multiple Vitamin (MULTIVITAMIN) tablet Take 1 tablet by mouth daily.    . predniSONE (DELTASONE)  20 MG tablet Take 2 tablets (40 mg total) by mouth daily. For 3 days then return to 20 mg daily 10 tablet 0  . rosuvastatin (CRESTOR) 5 MG tablet Take 1 tablet (5 mg total) by mouth daily. 90 tablet 0  . SPIRIVA HANDIHALER 18 MCG inhalation capsule PLACE 1 CAPSULE INTO INHALER DAILY 30 capsule 2  . Blood Glucose Monitoring Suppl (ONE TOUCH BASIC SYSTEM) W/DEVICE KIT TID and QHS 1 each 0  . Blood Glucose Monitoring Suppl (TRUE METRIX METER) W/DEVICE KIT Check blood sugar TID & QHS 1 kit 0  . etanercept (ENBREL SURECLICK) 50 MG/ML injection Inject 50 mg into the skin once a week. On Sundays    . GlucoCom Lancets MISC Check blood sugar  TID & QHS 100 each 5  . glucose blood (TRUETEST TEST) test strip Check sugars twice per day for E11.9 100 each 12  . glucose blood test strip Use as instructed 100 each 12  . glucose blood test strip Use as instructed 100 each 12   No current facility-administered medications on file prior to visit.   Family History  Problem Relation Age of Onset  . Diabetes Other   . Diabetes Mother   . Healthy Father   . Healthy Sister   . Heart disease Brother   . Healthy Sister   . Healthy Sister   . Healthy Sister   . Sudden death Brother   . Lupus Brother   . Healthy Brother    Social History   Social History  . Marital Status: Single    Spouse Name: N/A  . Number of Children: N/A  . Years of Education: N/A   Occupational History  . Not on file.   Social History Main Topics  . Smoking status: Former Smoker -- 0.50 packs/day    Types: Cigarettes    Quit date: 12/11/2014  . Smokeless tobacco: Never Used  . Alcohol Use: 0.0 oz/week    0 Standard drinks or equivalent per week     Comment: occasional  . Drug Use: No  . Sexual Activity: Not on file   Other Topics Concern  . Not on file   Social History Narrative    Review of Systems  Musculoskeletal: Positive for joint pain (rheumatoid) and falls (no in past 2 months).  Neurological: Positive for focal weakness. Negative for tingling.  All other systems reviewed and are negative.  Objective:   Filed Vitals:   06/16/15 1415  BP: 166/90  Pulse: 83  Temp: 98.5 F (36.9 C)  Resp: 17    Physical Exam  Constitutional: He is oriented to person, place, and time.  Neck: No JVD present.  Cardiovascular: Normal rate, regular rhythm and normal heart sounds.   Pulmonary/Chest: Effort normal and breath sounds normal.  Musculoskeletal: He exhibits no edema.  3+ BUE/BLE strength   Neurological: He is alert and oriented to person, place, and time.  Skin: Skin is warm and dry.  Psychiatric: He has a normal mood and affect.      Lab Results  Component Value Date   WBC 17.0* 05/23/2015   HGB 11.9* 05/23/2015   HCT 34.8* 05/23/2015   MCV 87.4 05/23/2015   PLT 344 05/23/2015   Lab Results  Component Value Date   CREATININE 0.86 05/23/2015   BUN 9 05/23/2015   NA 145 05/23/2015   K 3.2* 05/23/2015   CL 107 05/23/2015   CO2 25 05/23/2015    Lab Results  Component Value Date  HGBA1C 7.3* 04/04/2015   Lipid Panel  No results found for: CHOL, TRIG, HDL, CHOLHDL, VLDL, LDLCALC     Assessment and plan:   Jorge Malone was seen today for follow-up.  Diagnoses and all orders for this visit:  Other pulmonary embolism without acute cor pulmonale, unspecified chronicity (HCC) -     Refill apixaban (ELIQUIS) 5 MG TABS tablet; Take 1 tablet (5 mg total) by mouth 2 (two) times daily. Explained that he may be on Eliquis for 6-12 months but he will need a repeat scan of his lungs. He will need to establish care with his old PCP when he returns to [place].  Essential hypertension -   Refill hydrochlorothiazide (HYDRODIURIL) 25 MG tablet; Take 1 tablet (25 mg total) by mouth daily. BP likely elevated because he is aggravated with his daughter.  He will have his pressure rechecked when he returns to [place]  Type 2 diabetes mellitus with other circulatory complication, without long-term current use of insulin (HCC) -     metFORMIN (GLUCOPHAGE) 1000 MG tablet; TAKE 1 TABLET (1,000 MG TOTAL) BY MOUTH DAILY WITH BREAKFAST. -     lisinopril (PRINIVIL,ZESTRIL) 2.5 MG tablet; Take 1 tablet (2.5 mg total) by mouth daily. Patients diabetes is well control as evidence by consistently low a1c.  Patient will continue with current therapy and continue to make necessary lifestyle changes.  Reviewed foot care, diet, exercise, annual health maintenance with patient.   HLD (hyperlipidemia) -    Refilled rosuvastatin (CRESTOR) 5 MG tablet; Take 1 tablet (5 mg total) by mouth daily.  Bladder mass Patient has a Dealer in [place], he will  reestablish there  Multiple falls I have explained the importance of not falling and using assistive devices as directed. Explained that falling is dangerous while on blood thinners and the possible consequences of bleeding.      Return for 2 months if unable to make it to [place].   Lance Bosch, [place] [phone] 06/16/2015, 2:27 PM

## 2015-06-16 NOTE — Progress Notes (Signed)
Patient states he is here for follow up Recently discharged from rehab Had been in rehab for trouble walking from his arthritis

## 2015-06-19 ENCOUNTER — Telehealth: Payer: Self-pay | Admitting: Internal Medicine

## 2015-06-19 NOTE — Telephone Encounter (Signed)
Occupational Therapist Costella Hatcher from [facility] called requesting a verbal order to see patient  He would like to see patient, twice a month for one month and Once a month for one month. Please follow up.  [phone].

## 2015-06-19 NOTE — Telephone Encounter (Signed)
                           Returned call to Costella Hatcher from [facility] [phone] Verbal orders given for him to see patient twice a month for one month than once a month for one  Month for occupational therapy

## 2015-06-26 ENCOUNTER — Telehealth: Payer: Self-pay

## 2015-06-26 NOTE — Telephone Encounter (Signed)
[facility] pharmacy called to request a prior authorization for Eliquis. Patient was discharged from SNF. [facility] was informed that request would have to go through patient's PCP.

## 2015-07-25 ENCOUNTER — Other Ambulatory Visit: Payer: Self-pay | Admitting: Adult Health

## 2015-08-25 ENCOUNTER — Ambulatory Visit: Payer: Medicare Other | Admitting: Cardiology

## 2015-08-26 ENCOUNTER — Other Ambulatory Visit: Payer: Self-pay | Admitting: Adult Health

## 2015-09-24 ENCOUNTER — Other Ambulatory Visit: Payer: Self-pay | Admitting: Adult Health

## 2015-10-06 ENCOUNTER — Other Ambulatory Visit: Payer: Self-pay | Admitting: Adult Health

## 2016-01-07 IMAGING — CR DG ABDOMEN 1V
2 series · 2 of 2 positions shown · non-contrast
Comparison: CT 01/03/2015

CLINICAL DATA: Abdominal pain.

EXAM:
ABDOMEN - 1 VIEW

[t abdomen supine (1 of 2)]
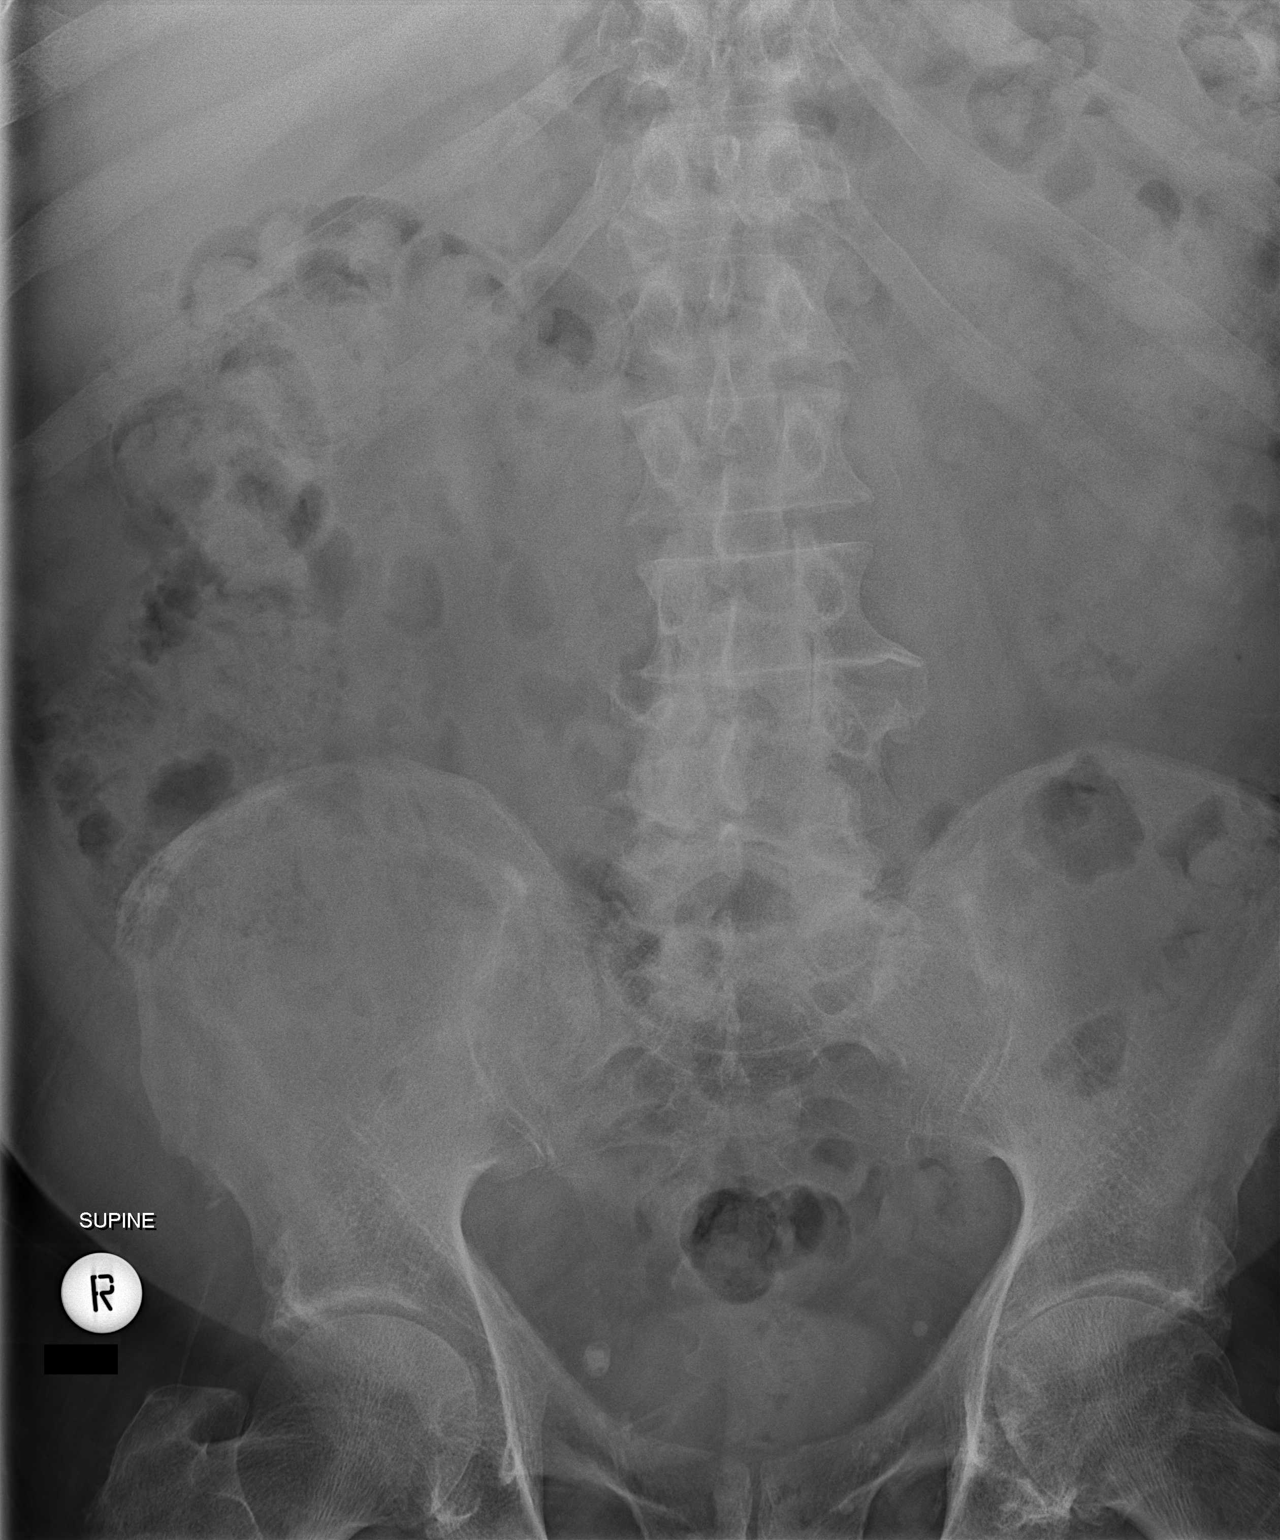

[t abdomen supine (2 of 2)]
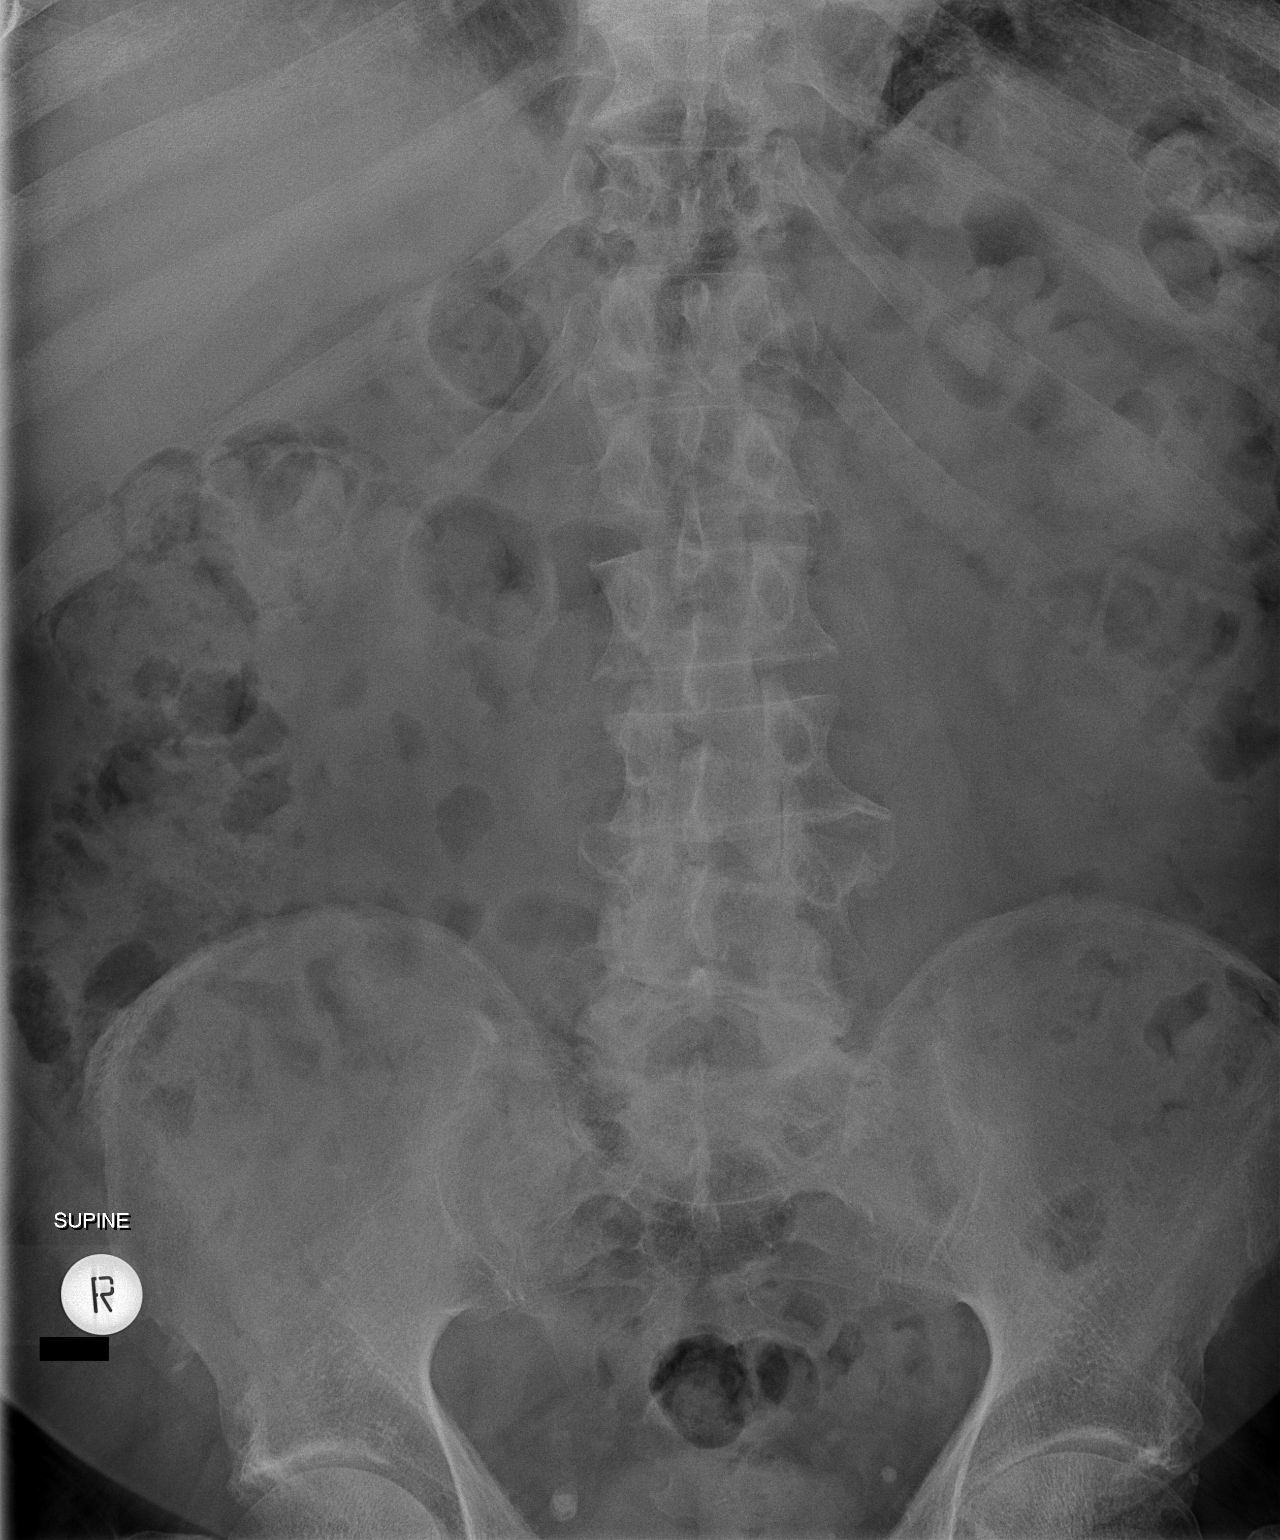

[2 of 2 positions shown; findings below may reference images not displayed]

FINDINGS: Soft tissue structures are unremarkable. No bowel distention. No
free air. Pelvic calcifications consistent phleboliths. Degenerative
changes lumbar spine.
IMPRESSION: No acute abnormality identified.  No bowel distention.

## 2016-09-28 DEATH — deceased
